# Patient Record
Sex: Female | Born: 1950
Health system: Southern US, Community
[De-identification: ages and names within clinical notes are randomized; demographics above are authoritative.]

## PROBLEM LIST (undated history)

## (undated) DIAGNOSIS — R011 Cardiac murmur, unspecified: Secondary | ICD-10-CM

## (undated) DIAGNOSIS — B019 Varicella without complication: Secondary | ICD-10-CM

## (undated) DIAGNOSIS — K5790 Diverticulosis of intestine, part unspecified, without perforation or abscess without bleeding: Secondary | ICD-10-CM

## (undated) DIAGNOSIS — C50919 Malignant neoplasm of unspecified site of unspecified female breast: Secondary | ICD-10-CM

## (undated) DIAGNOSIS — M1990 Unspecified osteoarthritis, unspecified site: Secondary | ICD-10-CM

## (undated) HISTORY — DX: Varicella without complication: B01.9

## (undated) HISTORY — DX: Cardiac murmur, unspecified: R01.1

## (undated) HISTORY — PX: ABDOMINAL HYSTERECTOMY: SHX81

## (undated) HISTORY — PX: FRACTURE SURGERY: SHX138

## (undated) HISTORY — DX: Diverticulosis of intestine, part unspecified, without perforation or abscess without bleeding: K57.90

## (undated) HISTORY — DX: Malignant neoplasm of unspecified site of unspecified female breast: C50.919

## (undated) HISTORY — DX: Unspecified osteoarthritis, unspecified site: M19.90

---

## 1971-07-08 HISTORY — PX: BREAST BIOPSY: SHX20

## 1992-07-07 HISTORY — PX: OTHER SURGICAL HISTORY: SHX169

## 2012-10-12 ENCOUNTER — Ambulatory Visit (INDEPENDENT_AMBULATORY_CARE_PROVIDER_SITE_OTHER): Admitting: Family Medicine

## 2012-10-12 ENCOUNTER — Encounter: Payer: Self-pay | Admitting: Family Medicine

## 2012-10-12 VITALS — BP 112/80 | Temp 98.5°F | Ht 62.5 in | Wt 133.0 lb

## 2012-10-12 DIAGNOSIS — M25869 Other specified joint disorders, unspecified knee: Secondary | ICD-10-CM

## 2012-10-12 DIAGNOSIS — Z7189 Other specified counseling: Secondary | ICD-10-CM

## 2012-10-12 DIAGNOSIS — M1710 Unilateral primary osteoarthritis, unspecified knee: Secondary | ICD-10-CM

## 2012-10-12 DIAGNOSIS — J309 Allergic rhinitis, unspecified: Secondary | ICD-10-CM

## 2012-10-12 DIAGNOSIS — M1711 Unilateral primary osteoarthritis, right knee: Secondary | ICD-10-CM

## 2012-10-12 DIAGNOSIS — Z7689 Persons encountering health services in other specified circumstances: Secondary | ICD-10-CM

## 2012-10-12 DIAGNOSIS — IMO0002 Reserved for concepts with insufficient information to code with codable children: Secondary | ICD-10-CM

## 2012-10-12 LAB — LIPID PANEL
HDL: 40.5 mg/dL (ref >39.00)
Triglycerides: 135 mg/dL (ref 0.0–149.0)

## 2012-10-12 LAB — HEMOGLOBIN A1C: Hgb A1c MFr Bld: 5.6 % (ref 4.6–6.5)

## 2012-10-12 MED ORDER — FLUTICASONE PROPIONATE 50 MCG/ACT NA SUSP: 2.0000 | Freq: Every day | NASAL | Status: DC

## 2012-10-12 NOTE — Progress Notes (Signed)
Chief Complaint  Patient presents with  . Establish Care    HPI:  Joanne Vaughn is here to establish care. Moved here from [place] recently. Last PCP and physical: hx of complete hysterectomy for fibroids so doctor in [place] did not do paps, has been a few years since had a mammogram  Has the following chronic problems and concerns today:  Patient Active Problem List  Diagnosis  . Patellofemoral dysfunction  . Allergic rhinitis  . Osteoarthritis of knees and back   OA: -in knees and neck and back -sees a chiropractor and this is helping some -does not take calcium or vitamin D  AR: -eye issues for a year - redness, itchy -has sneezing, a nasal symtoms  Health Maintenance: -s/p hysterectomy -had colonoscopy in 2009 - no polyps mammo a few years ago  ROS: See pertinent positives and negatives per HPI.  Past Medical History  Diagnosis Date  . Arthritis   . Chicken pox   . Heart murmur     since childhood, benign  . Diverticulosis     Family History  Problem Relation Age of Onset  . Arthritis      parent/grandparent  . Heart disease      parent/grandparent  . Diabetes Other   . Heart disease Mother 17    MI  . COPD Father   . Osteoarthritis Father   . Diabetes Sister   . Asthma Sister     History   Social History  . Marital Status: Married    Spouse Name: N/A    Number of Children: N/A  . Years of Education: N/A   Social History Main Topics  . Smoking status: Never Smoker   . Smokeless tobacco: None  . Alcohol Use: No  . Drug Use: None  . Sexually Active: None   Other Topics Concern  . 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  GENERAL: vitals reviewed and listed above, alert, oriented, appears well hydrated and in no acute distress  HEENT: atraumatic, conjunttiva clear, no obvious abnormalities on inspection of external nose and ears, normal appearance of ear canals and TMs, clear nasal congestion, pale boggy tubinates, mild post oropharyngeal erythema with PND and cobblestoning, no tonsillar edema or exudate, no sinus TTP   NECK: no obvious masses on inspection  LUNGS: clear to auscultation bilaterally, no wheezes, rales or rhonchi, good air movement  CV: HRRR, no peripheral edema  MS: moves all extremities without noticeable abnormality -gait is normal, normal inspection of static knees, J sign bilat, VM atrophy, +petellar crepitus, + single leg squat, no jt line TTP, neg ant/post drawer, neg lachman, neg mcmurry  PSYCH: pleasant and cooperative, no obvious depression or anxiety  ASSESSMENT AND PLAN:  Discussed the following assessment and plan:  Encounter to establish care - Plan: Hemoglobin A1c, Lipid Panel  Osteoarthritis  of knees and back  Allergic rhinitis - Plan: fluticasone (FLONASE) 50 MCG/ACT nasal spray  Patellofemoral dysfunction, unspecified laterality -We reviewed the PMH, PSH, FH, SH, Meds and Allergies. -We provided refills for any medications we will prescribe as needed. -We addressed current concerns per orders and patient instructions. -We have advised patient to follow up per instructions below. -NON-FASTING LABS TODAY  -Patient advised to return or notify a doctor immediately if symptoms worsen or persist or new concerns arise.  Patient Instructions  -We have ordered labs or studies at this visit. It can take up to 1-2 weeks for results and processing. We will contact  you with instructions IF your results are abnormal. Normal results will be released to your [facility]. If you have not heard from [place] or can not find your results in [facility] in 2 weeks please contact our office.  -PLEASE SIGN UP FOR MYCHART TODAY   We recommend the following healthy lifestyle measures: - eat a healthy diet consisting of lots of vegetables, fruits, beans, nuts, seeds, healthy meats such as white chicken and fish and whole grains.  - avoid fried foods, fast food, processed foods, sodas, red meet and other fattening foods.  - get a least 150 minutes of aerobic exercise per week.   Take 1000IU of vitamin D3 daily and Calcium 1200mg  daily  For the allergies try zyrtec nightly and the flonase nasal spray - start with 2 sprays each nostril daily for 1 month, then can decrease to 1 spray each nostril daily  For the arthritis pain can use capsacin and menthol products and tylenol up to 500-1000mg  up to three times daily, regular exercise. Do the knee exercises provided.  Schedule a mammogram  See the opthomologist  Follow up in: 3-4 months for physical      Joanne Vaughn, Dahlia Client R.

## 2012-10-12 NOTE — Patient Instructions (Addendum)
-  We have ordered labs or studies at this visit. It can take up to 1-2 weeks for results and processing. We will contact you with instructions IF your results are abnormal. Normal results will be released to your [facility]. If you have not heard from [place] or can not find your results in [facility] in 2 weeks please contact our office.  -PLEASE SIGN UP FOR MYCHART TODAY   We recommend the following healthy lifestyle measures: - eat a healthy diet consisting of lots of vegetables, fruits, beans, nuts, seeds, healthy meats such as white chicken and fish and whole grains.  - avoid fried foods, fast food, processed foods, sodas, red meet and other fattening foods.  - get a least 150 minutes of aerobic exercise per week.   Take 1000IU of vitamin D3 daily and Calcium 1200mg  daily  For the allergies try zyrtec nightly and the flonase nasal spray - start with 2 sprays each nostril daily for 1 month, then can decrease to 1 spray each nostril daily  For the arthritis pain can use capsacin and menthol products and tylenol up to 500-1000mg  up to three times daily, regular exercise. Do the knee exercises provided.  Schedule a mammogram  See the opthomologist  Follow up in: 3-4 months for physical

## 2012-10-13 ENCOUNTER — Telehealth: Payer: Self-pay | Admitting: Family Medicine

## 2012-10-13 NOTE — Telephone Encounter (Signed)
Left a message for pt to return call 

## 2012-10-13 NOTE — Telephone Encounter (Signed)
Called and spoke with pt and pt is aware.  

## 2012-10-13 NOTE — Telephone Encounter (Signed)
Labs look ok. Cholesterol is a little high, but some of this is due to the fact she was not fasting. No medicine advised. Regular exercise and a healthy diet are best.

## 2012-12-15 ENCOUNTER — Encounter: Payer: Self-pay | Admitting: Family Medicine

## 2012-12-15 ENCOUNTER — Ambulatory Visit (INDEPENDENT_AMBULATORY_CARE_PROVIDER_SITE_OTHER): Admitting: Family Medicine

## 2012-12-15 VITALS — BP 130/60 | Temp 98.9°F | Wt 132.0 lb

## 2012-12-15 DIAGNOSIS — W57XXXA Bitten or stung by nonvenomous insect and other nonvenomous arthropods, initial encounter: Secondary | ICD-10-CM

## 2012-12-15 DIAGNOSIS — T148 Other injury of unspecified body region: Secondary | ICD-10-CM

## 2012-12-15 DIAGNOSIS — M2550 Pain in unspecified joint: Secondary | ICD-10-CM

## 2012-12-15 MED ORDER — DOXYCYCLINE HYCLATE 100 MG PO TABS: 100.0000 mg | ORAL_TABLET | Freq: Two times a day (BID) | ORAL | Status: DC

## 2012-12-15 NOTE — Progress Notes (Signed)
  Subjective:    Patient ID: Joanne Vaughn, female    DOB: [birth year], 62 y.o.   MRN: [id number]  HPI Acute visit Patient had tick bite 3 weeks ago left lower leg. She described this as a deer tick She's had a few days now of severe achiness involving hands back neck knees She's not seen any objective evidence for swelling or warmth or erythema. She does have some surrounding erythema around the site of the bite. Denies fever. No headaches. Just some localized itching around the site of tick bite Denies any known drug allergies.  No recent travels.  Past Medical History  Diagnosis Date  . Arthritis   . Chicken pox   . Heart murmur     since childhood, benign  . Diverticulosis    Past Surgical History  Procedure Laterality Date  . Abdominal hysterectomy    . Ovaries removed  1994  . Breast biopsy  1973    benign    reports that she has never smoked. She does not have any smokeless tobacco history on file. She reports that she does not drink alcohol. Her drug history is not on file. family history includes Arthritis in an unspecified family member; Asthma in her sister; COPD in her father; Diabetes in her other and sister; Heart disease in an unspecified family member; Heart disease (age of onset: 34) in her mother; and Osteoarthritis in her father. No Known Allergies    Review of Systems  Constitutional: Negative for fever and chills.  Respiratory: Negative for shortness of breath.   Gastrointestinal: Negative for nausea and vomiting.  Musculoskeletal: Positive for arthralgias.  Skin: Positive for rash.  Neurological: Negative for headaches.  Hematological: Negative for adenopathy.       Objective:   Physical Exam  Constitutional: She appears well-developed and well-nourished.  Cardiovascular: Normal rate and regular rhythm.   Pulmonary/Chest: Effort normal and breath sounds normal. No respiratory distress. She has no wheezes. She has no rales.  Musculoskeletal:   She does not have any evidence to suggest joint inflammation such as visible edema, warmth, or erythema  Skin:  Patient has some nonspecific mild erythema surrounding site of recent bite left leg. No visible retained tick parts. No pustules. This is not typical of classic erythema chronicum migrans. No other rashes noted          Assessment & Plan:  New onset arthralgias following recent tick bite. We explained incidence of Lyme disease in [place] very low. We discussed other possibilities such as STARI.  Discussed limitations of antibody testing for Lyme disease. We decided to go and cover with doxycycline 100 mg twice a day for 14 days. Touch base in one to 2 weeks if arthralgias not improving and sooner if any fever headache or worsening symptoms

## 2012-12-15 NOTE — Patient Instructions (Addendum)
Follow up for any fever, headache, progressive rash, or if joint pains not improving over next 1-2 weeks.

## 2013-01-18 ENCOUNTER — Other Ambulatory Visit: Payer: Self-pay | Admitting: Family Medicine

## 2013-01-18 ENCOUNTER — Other Ambulatory Visit: Payer: Self-pay

## 2013-01-18 DIAGNOSIS — Z1231 Encounter for screening mammogram for malignant neoplasm of breast: Secondary | ICD-10-CM

## 2013-02-11 ENCOUNTER — Encounter: Admitting: Family Medicine

## 2013-02-23 ENCOUNTER — Ambulatory Visit
Admission: RE | Admit: 2013-02-23 | Discharge: 2013-02-23 | Disposition: A | Discharge status: Home / Self Care | Source: Ambulatory Visit

## 2013-02-23 DIAGNOSIS — Z1231 Encounter for screening mammogram for malignant neoplasm of breast: Secondary | ICD-10-CM

## 2013-02-24 ENCOUNTER — Other Ambulatory Visit: Payer: Self-pay | Admitting: Family Medicine

## 2013-02-24 DIAGNOSIS — R928 Other abnormal and inconclusive findings on diagnostic imaging of breast: Secondary | ICD-10-CM

## 2013-02-28 ENCOUNTER — Ambulatory Visit (INDEPENDENT_AMBULATORY_CARE_PROVIDER_SITE_OTHER): Admitting: Family Medicine

## 2013-02-28 ENCOUNTER — Encounter: Payer: Self-pay | Admitting: Family Medicine

## 2013-02-28 VITALS — BP 110/82 | Temp 98.4°F | Ht 62.5 in | Wt 135.0 lb

## 2013-02-28 DIAGNOSIS — R928 Other abnormal and inconclusive findings on diagnostic imaging of breast: Secondary | ICD-10-CM

## 2013-02-28 DIAGNOSIS — Z0000 Encounter for general adult medical examination without abnormal findings: Secondary | ICD-10-CM

## 2013-02-28 DIAGNOSIS — Z23 Encounter for immunization: Secondary | ICD-10-CM

## 2013-02-28 DIAGNOSIS — E785 Hyperlipidemia, unspecified: Secondary | ICD-10-CM

## 2013-02-28 DIAGNOSIS — Z2911 Encounter for prophylactic immunotherapy for respiratory syncytial virus (RSV): Secondary | ICD-10-CM

## 2013-02-28 LAB — LIPID PANEL
Cholesterol: 271 mg/dL — ABNORMAL HIGH (ref 0–200)
Total CHOL/HDL Ratio: 7

## 2013-02-28 NOTE — Addendum Note (Signed)
Addended by: Azucena Freed on: 02/28/2013 10:08 AM   Modules accepted: Orders

## 2013-02-28 NOTE — Progress Notes (Signed)
Chief Complaint  Patient presents with  . Annual Exam    HPI:  Here for CPE:  -Concerns today: none  1) abnormal mammo: -[place] and mammo ordered  2)HLD: -she wants to recheck labs today  -Diet: variety of foods, balance and well rounded  -Vit D and calcium: takes calcium and vit D  -Exercise: ellipitical a few times per week and walking  -Diabetes and Dyslipidemia Screening: done 10/2012 - cholesterol borderline  -Hx of HTN: no  -Vaccines: needs tdap, zostavax, flu this year  -pap history:  Hx complete hysterectomy for fibroids  -FDLMP: N/A  -sexual activity: yes, female partner, no new partners  -wants STI testing: no  -FH breast, colon or ovarian ca: see FH -had mammogram this year last week and is getting repeat US and mammo -last colonoscopy 2009 per her report - normal  -Alcohol, Tobacco, drug use: see social history  Review of Systems - Review of Systems  Constitutional: Negative for chills and malaise/fatigue.  Eyes: Negative for blurred vision.  Respiratory: Negative for shortness of breath.   Cardiovascular: Negative for chest pain and palpitations.  Gastrointestinal: Negative for abdominal pain and blood in stool.  Genitourinary: Negative for dysuria.  Musculoskeletal: Negative for myalgias.  Neurological: Negative for dizziness.  Endo/Heme/Allergies: Does not bruise/bleed easily.  Psychiatric/Behavioral: Negative for depression.     Past Medical History  Diagnosis Date  . Arthritis   . Chicken pox   . Heart murmur     since childhood, benign  . Diverticulosis     Family History  Problem Relation Age of Onset  . Arthritis      parent/grandparent  . Heart disease      parent/grandparent  . Diabetes Other   . Heart disease Mother 77    MI  . COPD Father   . Osteoarthritis Father   . Diabetes Sister   . Asthma Sister     History   Social History  . Marital Status: Married    Spouse Name: N/A    Number of Children: N/A  . Years of  Education: N/A   Social History Main Topics  . Smoking status: Never Smoker   . Smokeless tobacco: None  . Alcohol Use: No  . Drug Use: None  . Sexual Activity: None   Other Topics Concern  . 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[place]      Home Situation: lives with husband      Spiritual Beliefs: Marilynne Drivers, Ephriam Knuckles      Lifestyle: elliptical 20 minutes a few times per week, very active - gardening, planting, healthy diet                Current outpatient prescriptions:fluticasone (FLONASE) 50 MCG/ACT nasal spray, Place 2 sprays into the nose as needed., Disp: , Rfl: ;  doxycycline (VIBRA-TABS) 100 MG tablet, Take 1 tablet (100 mg total) by mouth 2 (two) times daily., Disp: 28 tablet, Rfl: 0  EXAM:  Filed Vitals:   02/28/13 0930  BP: 110/82  Temp: 98.4 F (36.9 C)    GENERAL: vitals reviewed and listed below, alert, oriented, appears well hydrated and in no acute distress  HEENT: head atraumatic, PERRLA, normal appearance of eyes, ears, nose and mouth. moist mucus membranes.  NECK: supple, no masses or lymphadenopathy  LUNGS: clear to auscultation bilaterally, no rales, rhonchi or wheeze  CV: HRRR, no peripheral edema or cyanosis, normal pedal pulses  BREAST: deferred  ABDOMEN: bowel sounds normal, soft, non tender to palpation, no masses, no rebound or guarding  GU: deferred  RECTAL: refused  SKIN: no rash or abnormal lesions  MS: normal gait, moves all extremities normally  NEURO: CN II-XII grossly intact, normal muscle strength and sensation to light touch on extremities  PSYCH: normal affect, pleasant and cooperative  ASSESSMENT AND PLAN:  Discussed the following assessment and plan:  Hyperlipemia - Plan: Lipid Panel  Visit for preventive health examination  Abnormal mammogram   -Discussed and advised all [place] preventive services health task force level A and B recommendations for  age, sex and risks.  -shingles and tdap today  -she will get repeat mammo and [place], deferred breast exam - discussed today as she asked but advise breast center will contact her and if she has not heard from breast center to contact breast center  -FASTING lipids today  -Advised at least 150 minutes of exercise per week and a healthy diet low in saturated fats and sweets and consisting of fresh fruits and vegetables, lean meats such as fish and white chicken and whole grains.  -labs, studies and vaccines per orders this encounter  Orders Placed This Encounter  Procedures  . Lipid Panel    There are no Patient Instructions on file for this visit.  Patient advised to return to clinic immediately if symptoms worsen or persist or new concerns.    No Follow-up on file.  Kriste Basque R.

## 2013-03-02 ENCOUNTER — Telehealth: Payer: Self-pay

## 2013-03-02 MED ORDER — PRAVASTATIN SODIUM 40 MG PO TABS: 40.0000 mg | ORAL_TABLET | Freq: Every day | ORAL | Status: DC

## 2013-03-02 NOTE — Progress Notes (Signed)
Quick Note:  Called and spoke with pt and pt is aware. ______ 

## 2013-03-02 NOTE — Progress Notes (Signed)
Quick Note:    Results released to mychart.

## 2013-03-02 NOTE — Progress Notes (Signed)
Quick Note:  Left a message for pt to return call. ______ 

## 2013-03-02 NOTE — Telephone Encounter (Signed)
Rx sent to pharmacy   

## 2013-03-02 NOTE — Telephone Encounter (Signed)
Called pt about lab results.  Pt states the arm she received the zostavax injection in has a 3 inch hard place, it is warm to touch, and pt is having arm pain.  Advised pt to take ibuprofen and apply compresses. Advised pt to follow up if symptoms worsen.   Pt would like to know if Dr. Selena Batten has any more recommendations.

## 2013-03-02 NOTE — Addendum Note (Signed)
Addended by: Azucena Freed on: 03/02/2013 01:26 PM   Modules accepted: Orders

## 2013-03-18 ENCOUNTER — Ambulatory Visit
Admission: RE | Admit: 2013-03-18 | Discharge: 2013-03-18 | Disposition: A | Discharge status: Home / Self Care | Source: Ambulatory Visit | Attending: Family Medicine | Admitting: Family Medicine

## 2013-03-18 DIAGNOSIS — R928 Other abnormal and inconclusive findings on diagnostic imaging of breast: Secondary | ICD-10-CM

## 2013-05-12 ENCOUNTER — Other Ambulatory Visit: Payer: Self-pay

## 2013-09-16 ENCOUNTER — Inpatient Hospital Stay (HOSPITAL_COMMUNITY)
Admission: EM | Admit: 2013-09-16 | Discharge: 2013-09-19 | DRG: 481 | Disposition: A | Discharge status: Home Health | Attending: Internal Medicine | Admitting: Internal Medicine

## 2013-09-16 ENCOUNTER — Emergency Department (HOSPITAL_COMMUNITY)

## 2013-09-16 ENCOUNTER — Encounter (HOSPITAL_COMMUNITY): Payer: Self-pay | Admitting: Emergency Medicine

## 2013-09-16 DIAGNOSIS — Z825 Family history of asthma and other chronic lower respiratory diseases: Secondary | ICD-10-CM

## 2013-09-16 DIAGNOSIS — Z833 Family history of diabetes mellitus: Secondary | ICD-10-CM

## 2013-09-16 DIAGNOSIS — J309 Allergic rhinitis, unspecified: Secondary | ICD-10-CM

## 2013-09-16 DIAGNOSIS — R011 Cardiac murmur, unspecified: Secondary | ICD-10-CM | POA: Diagnosis present

## 2013-09-16 DIAGNOSIS — Z8249 Family history of ischemic heart disease and other diseases of the circulatory system: Secondary | ICD-10-CM

## 2013-09-16 DIAGNOSIS — M1710 Unilateral primary osteoarthritis, unspecified knee: Secondary | ICD-10-CM | POA: Diagnosis present

## 2013-09-16 DIAGNOSIS — S52599A Other fractures of lower end of unspecified radius, initial encounter for closed fracture: Secondary | ICD-10-CM | POA: Diagnosis present

## 2013-09-16 DIAGNOSIS — M1711 Unilateral primary osteoarthritis, right knee: Secondary | ICD-10-CM | POA: Diagnosis present

## 2013-09-16 DIAGNOSIS — S72143A Displaced intertrochanteric fracture of unspecified femur, initial encounter for closed fracture: Principal | ICD-10-CM | POA: Diagnosis present

## 2013-09-16 DIAGNOSIS — M25869 Other specified joint disorders, unspecified knee: Secondary | ICD-10-CM

## 2013-09-16 DIAGNOSIS — S5292XA Unspecified fracture of left forearm, initial encounter for closed fracture: Secondary | ICD-10-CM

## 2013-09-16 DIAGNOSIS — W010XXA Fall on same level from slipping, tripping and stumbling without subsequent striking against object, initial encounter: Secondary | ICD-10-CM | POA: Diagnosis present

## 2013-09-16 DIAGNOSIS — S72142A Displaced intertrochanteric fracture of left femur, initial encounter for closed fracture: Secondary | ICD-10-CM

## 2013-09-16 DIAGNOSIS — Z79899 Other long term (current) drug therapy: Secondary | ICD-10-CM

## 2013-09-16 DIAGNOSIS — S72009A Fracture of unspecified part of neck of unspecified femur, initial encounter for closed fracture: Secondary | ICD-10-CM | POA: Diagnosis present

## 2013-09-16 MED ORDER — FENTANYL CITRATE 0.05 MG/ML IJ SOLN
50.0000 ug | Freq: Once | INTRAMUSCULAR | Status: AC
  Administered 2013-09-16: 50 ug via INTRAMUSCULAR

## 2013-09-16 MED ORDER — FENTANYL CITRATE 0.05 MG/ML IJ SOLN
INTRAMUSCULAR | Status: AC
  Administered 2013-09-16: 50 ug via INTRAMUSCULAR
  Filled 2013-09-16: qty 2

## 2013-09-16 NOTE — ED Notes (Signed)
Per EMS: pt was taking dog outside; fell over dog. C/o swelling to  left wrist; left upper femur tender to palpation.

## 2013-09-16 NOTE — ED Notes (Signed)
Pt fell outside while walking the dog, crawled back to house(raining and chilly), changed clothes when she got inside with her husbands help. Still wearing wet underclothes. Pt undressed carefully, left leg not moved. Wrist then elevated and ice applied.

## 2013-09-16 NOTE — ED Provider Notes (Addendum)
CSN: [id number]     Arrival date & time 09/16/13  2308 History  This chart was scribed for Varney Biles, MD by Elby Beck, ED Scribe. This patient was seen in room APA05/APA05 and the patient's care was started at 11:42 PM.   Chief Complaint  Patient presents with  . Fall  . Hip Pain    The history is provided by the patient. No language interpreter was used.    HPI Comments: Ski Norred is a right-handed 63 y.o. Female with no chronic medical conditions who presents to the Emergency Department complaining of a mechanical fall that occurred earlier tonight when pt reports tripping over her dog in her driveway. She states that she landed on gravel on her left side. She denies head injury or LOC pertaining to the fall. She is complaining of constant, moderate left hip pain and left wrist pain onset after the fall. She reports that she had to crawl after falling and she has not been able to walk without the assistance of her husband. She states that she is unable to bear any weight on her left leg. She states that she does not take any blood thinning medications. She denies any prior injuries or of surgical history to her hip or wrist (other than a cyst removal from the wrist). She denies headache, neck pain or any other pain or symptoms.   Past Medical History  Diagnosis Date  . Arthritis   . Chicken pox   . Heart murmur     since childhood, benign  . Diverticulosis    Past Surgical History  Procedure Laterality Date  . Abdominal hysterectomy    . Ovaries removed  1994  . Breast biopsy  1973    benign   Family History  Problem Relation Age of Onset  . Arthritis      parent/grandparent  . Heart disease      parent/grandparent  . Diabetes Other   . Heart disease Mother 52    MI  . COPD Father   . Osteoarthritis Father   . Diabetes Sister   . Asthma Sister    History  Substance Use Topics  . Smoking status: Never Smoker   . Smokeless tobacco: Not on file  . Alcohol  Use: No   OB History   Grav Para Term Preterm Abortions TAB SAB Ect Mult Living                 Review of Systems  Musculoskeletal: Positive for arthralgias (left wrist, left hip) and gait problem. Negative for neck pain.  Neurological: Negative for syncope and headaches.  All other systems reviewed and are negative.   Allergies  Review of patient's allergies indicates no known allergies.  Home Medications   Current Outpatient Rx  Name  Route  Sig  Dispense  Refill  . doxycycline (VIBRA-TABS) 100 MG tablet   Oral   Take 1 tablet (100 mg total) by mouth 2 (two) times daily.   28 tablet   0   . fluticasone (FLONASE) 50 MCG/ACT nasal spray   Nasal   Place 2 sprays into the nose as needed.         . pravastatin (PRAVACHOL) 40 MG tablet   Oral   Take 1 tablet (40 mg total) by mouth daily.   90 tablet   1   . pravastatin (PRAVACHOL) 40 MG tablet   Oral   Take 1 tablet (40 mg total) by mouth daily.   90 tablet  1    Triage Vitals: BP 141/59  Pulse 69  Temp(Src) 97.8 F (36.6 C) (Oral)  Resp 18  SpO2 100%  Physical Exam  Nursing note and vitals reviewed. Constitutional: She is oriented to person, place, and time. She appears well-developed and well-nourished. No distress.  HENT:  Head: Normocephalic and atraumatic.  No obvious trauma seen. No contusions. No bleeding.   Eyes: EOM are normal.  Neck: Neck supple. No tracheal deviation present.  No midline C-spine tenderness  Cardiovascular: Normal rate, regular rhythm and intact distal pulses.   2+ ulnar and radial pulses.  Pulmonary/Chest: Effort normal and breath sounds normal. No respiratory distress.  Musculoskeletal: She exhibits tenderness.  Right upper extremity exam normal Left upper extremity: There is mild swelling at the left wrist with tenderness on the radial aspect of the wrist. Hand exam shows no tenderness  Left lower extremity: No gross deformity. No contusions. No hematoma. Left hip  tenderness. Right lower extremity: No gross deformity.   Neurological: She is alert and oriented to person, place, and time.  GCS is 15.  Skin: Skin is warm and dry.  Psychiatric: She has a normal mood and affect. Her behavior is normal.    ED Course  Procedures (including critical care time)  DIAGNOSTIC STUDIES: Oxygen Saturation is 100% on RA, normal by my interpretation.    COORDINATION OF CARE: 11:48 PM- Discussed plan to obtain an X-ray of left wrist and X-ray of left hip/pelvis. Will also order Fentanyl. Pt advised of plan for treatment and pt agrees.  Medications  fentaNYL (SUBLIMAZE) injection 50 mcg (50 mcg Intramuscular Given 09/16/13 2345)   Labs Review Labs Reviewed  CBC WITH DIFFERENTIAL - Abnormal; Notable for the following:    WBC 13.8 (*)    Neutrophils Relative % 83 (*)    Neutro Abs 11.4 (*)    Lymphocytes Relative 10 (*)    All other components within normal limits  PROTIME-INR  BASIC METABOLIC PANEL   Imaging Review Dg Chest 1 View  09/17/2013   CLINICAL DATA:  Fall  EXAM: CHEST - 1 VIEW  COMPARISON:  None.  FINDINGS: The heart size and mediastinal contours are within normal limits. Both lungs are clear. The visualized skeletal structures are unremarkable.  IMPRESSION: No active disease.   Electronically Signed   By: Franchot Gallo M.D.   On: 09/17/2013 00:38   Dg Wrist Complete Left  09/17/2013   CLINICAL DATA:  Left wrist pain following a fall tonight.  EXAM: LEFT WRIST - COMPLETE 3+ VIEW  COMPARISON:  None.  FINDINGS: Comminuted and mildly impacted transverse fracture of the distal radial metaphysis with probable extension into the radiocarpal joint. Mild dorsal angulation of the distal fragment without significant displacement.  IMPRESSION: Distal radius fracture, as described above.   Electronically Signed   By: Enrique Sack M.D.   On: 09/17/2013 00:31   Dg Hip Complete Left  09/17/2013   CLINICAL DATA:  Left hip pain following a fall tonight.  EXAM: LEFT  HIP - COMPLETE 2+ VIEW  COMPARISON:  None.  FINDINGS: Left intertrochanteric fracture without significant displacement or angulation.  IMPRESSION: Essentially nondisplaced left intertrochanteric fracture.   Electronically Signed   By: Enrique Sack M.D.   On: 09/17/2013 00:29     EKG Interpretation None      MDM   Final diagnoses:  None   I personally performed the services described in this documentation, which was scribed in my presence. The recorded information has been  reviewed and is accurate.  Pt comes in with cc of fall. Has sustained a distal radial fracture and left femoral intertrochanteric fx. Will need to be admitted.  Varney Biles, MD 09/17/13 0116  1:28 AM Dr. Gladstone Lighter, [place] [facility] will communicate aboutthe patient to one of his partners. Patient will be admitted to [facility]. NPO after MN started, all other decisions, including surgery, will be determined tomorrow.  Pt made aware of this.  Varney Biles, MD 09/17/13 [phone]

## 2013-09-16 NOTE — ED Notes (Signed)
Pt reports she was unable to stand after falling, reports crawling back into the house to wake her husband to call EMS.

## 2013-09-17 ENCOUNTER — Encounter (HOSPITAL_COMMUNITY): Payer: Self-pay | Admitting: Anesthesiology

## 2013-09-17 ENCOUNTER — Encounter (HOSPITAL_COMMUNITY): Admitting: Anesthesiology

## 2013-09-17 ENCOUNTER — Encounter (HOSPITAL_COMMUNITY)
Admission: EM | Disposition: A | Discharge status: Home Health | Payer: Self-pay | Source: Home / Self Care | Attending: Internal Medicine

## 2013-09-17 ENCOUNTER — Inpatient Hospital Stay (HOSPITAL_COMMUNITY)

## 2013-09-17 ENCOUNTER — Inpatient Hospital Stay (HOSPITAL_COMMUNITY): Admitting: Anesthesiology

## 2013-09-17 ENCOUNTER — Emergency Department (HOSPITAL_COMMUNITY)

## 2013-09-17 DIAGNOSIS — S5290XA Unspecified fracture of unspecified forearm, initial encounter for closed fracture: Secondary | ICD-10-CM

## 2013-09-17 DIAGNOSIS — S72009A Fracture of unspecified part of neck of unspecified femur, initial encounter for closed fracture: Secondary | ICD-10-CM | POA: Diagnosis present

## 2013-09-17 DIAGNOSIS — S72143A Displaced intertrochanteric fracture of unspecified femur, initial encounter for closed fracture: Principal | ICD-10-CM

## 2013-09-17 HISTORY — PX: INTRAMEDULLARY (IM) NAIL INTERTROCHANTERIC: SHX5875

## 2013-09-17 LAB — CBC WITH DIFFERENTIAL/PLATELET
BASOS PCT: 0 % (ref 0–1)
Basophils Absolute: 0.1 10*3/uL (ref 0.0–0.1)
EOS PCT: 1 % (ref 0–5)
Eosinophils Absolute: 0.1 10*3/uL (ref 0.0–0.7)
HEMATOCRIT: 37 % (ref 36.0–46.0)
HEMOGLOBIN: 12.7 g/dL (ref 12.0–15.0)
Lymphocytes Relative: 10 % — ABNORMAL LOW (ref 12–46)
Lymphs Abs: 1.4 10*3/uL (ref 0.7–4.0)
MCH: 31.2 pg (ref 26.0–34.0)
MCHC: 34.3 g/dL (ref 30.0–36.0)
MCV: 90.9 fL (ref 78.0–100.0)
MONO ABS: 0.8 10*3/uL (ref 0.1–1.0)
MONOS PCT: 6 % (ref 3–12)
NEUTROS ABS: 11.4 10*3/uL — AB (ref 1.7–7.7)
Neutrophils Relative %: 83 % — ABNORMAL HIGH (ref 43–77)
Platelets: 214 10*3/uL (ref 150–400)
RBC: 4.07 MIL/uL (ref 3.87–5.11)
RDW: 12.7 % (ref 11.5–15.5)
WBC: 13.8 10*3/uL — ABNORMAL HIGH (ref 4.0–10.5)

## 2013-09-17 LAB — URINE MICROSCOPIC-ADD ON

## 2013-09-17 LAB — BASIC METABOLIC PANEL
BUN: 17 mg/dL (ref 6–23)
CALCIUM: 8.9 mg/dL (ref 8.4–10.5)
CHLORIDE: 98 meq/L (ref 96–112)
CO2: 27 mEq/L (ref 19–32)
CREATININE: 0.69 mg/dL (ref 0.50–1.10)
GFR calc non Af Amer: >90 mL/min (ref >90)
Glucose, Bld: 139 mg/dL — ABNORMAL HIGH (ref 70–99)
Potassium: 3.6 mEq/L — ABNORMAL LOW (ref 3.7–5.3)
Sodium: 139 mEq/L (ref 137–147)

## 2013-09-17 LAB — URINALYSIS, ROUTINE W REFLEX MICROSCOPIC
Bilirubin Urine: NEGATIVE
GLUCOSE, UA: NEGATIVE mg/dL
Ketones, ur: 15 mg/dL — AB
Leukocytes, UA: NEGATIVE
Nitrite: NEGATIVE
Protein, ur: NEGATIVE mg/dL
Specific Gravity, Urine: 1.02 (ref 1.005–1.030)
Urobilinogen, UA: 0.2 mg/dL (ref 0.0–1.0)
pH: 6 (ref 5.0–8.0)

## 2013-09-17 LAB — PROTIME-INR
INR: 0.97 (ref 0.00–1.49)
Prothrombin Time: 12.7 seconds (ref 11.6–15.2)

## 2013-09-17 LAB — MRSA PCR SCREENING: MRSA by PCR: NEGATIVE

## 2013-09-17 SURGERY — FIXATION, FRACTURE, INTERTROCHANTERIC, WITH INTRAMEDULLARY ROD
Anesthesia: General | Site: Hip | Laterality: Left

## 2013-09-17 MED ORDER — FLEET ENEMA 7-19 GM/118ML RE ENEM
1.0000 | ENEMA | Freq: Once | RECTAL | Status: AC | PRN
  Filled 2013-09-17: qty 1

## 2013-09-17 MED ORDER — METOCLOPRAMIDE HCL 5 MG/ML IJ SOLN: 5.0000 mg | Freq: Three times a day (TID) | INTRAMUSCULAR | Status: DC | PRN

## 2013-09-17 MED ORDER — ONDANSETRON HCL 4 MG/2ML IJ SOLN: 4.0000 mg | Freq: Four times a day (QID) | INTRAMUSCULAR | Status: DC | PRN

## 2013-09-17 MED ORDER — HYDROMORPHONE HCL PF 1 MG/ML IJ SOLN
1.0000 mg | INTRAMUSCULAR | Status: DC | PRN
  Administered 2013-09-17 (×2): 1 mg via INTRAVENOUS
  Filled 2013-09-17 (×2): qty 1

## 2013-09-17 MED ORDER — EPHEDRINE SULFATE 50 MG/ML IJ SOLN
INTRAMUSCULAR | Status: AC
  Filled 2013-09-17: qty 1

## 2013-09-17 MED ORDER — METHOCARBAMOL 500 MG PO TABS
500.0000 mg | ORAL_TABLET | Freq: Four times a day (QID) | ORAL | Status: DC | PRN
  Administered 2013-09-18 (×2): 500 mg via ORAL
  Filled 2013-09-17 (×3): qty 1

## 2013-09-17 MED ORDER — 0.9 % SODIUM CHLORIDE (POUR BTL) OPTIME
TOPICAL | Status: DC | PRN
  Administered 2013-09-17: 1000 mL

## 2013-09-17 MED ORDER — GLYCOPYRROLATE 0.2 MG/ML IJ SOLN
INTRAMUSCULAR | Status: AC
  Filled 2013-09-17: qty 2

## 2013-09-17 MED ORDER — CISATRACURIUM BESYLATE 20 MG/10ML IV SOLN
INTRAVENOUS | Status: AC
  Filled 2013-09-17: qty 10

## 2013-09-17 MED ORDER — TRAMADOL HCL 50 MG PO TABS
50.0000 mg | ORAL_TABLET | Freq: Four times a day (QID) | ORAL | Status: DC | PRN
  Administered 2013-09-19: 100 mg via ORAL
  Filled 2013-09-17: qty 1
  Filled 2013-09-17: qty 2

## 2013-09-17 MED ORDER — DOCUSATE SODIUM 100 MG PO CAPS
100.0000 mg | ORAL_CAPSULE | Freq: Two times a day (BID) | ORAL | Status: DC
  Administered 2013-09-17 – 2013-09-18 (×4): 100 mg via ORAL

## 2013-09-17 MED ORDER — SODIUM CHLORIDE 0.9 % IJ SOLN
INTRAMUSCULAR | Status: AC
  Filled 2013-09-17: qty 10

## 2013-09-17 MED ORDER — ACETAMINOPHEN 650 MG RE SUPP
650.0000 mg | Freq: Four times a day (QID) | RECTAL | Status: DC | PRN
  Filled 2013-09-17: qty 1

## 2013-09-17 MED ORDER — OXYCODONE HCL 5 MG PO TABS
5.0000 mg | ORAL_TABLET | ORAL | Status: DC | PRN
  Administered 2013-09-17 – 2013-09-19 (×5): 10 mg via ORAL
  Filled 2013-09-17 (×6): qty 2

## 2013-09-17 MED ORDER — PHENYLEPHRINE 40 MCG/ML (10ML) SYRINGE FOR IV PUSH (FOR BLOOD PRESSURE SUPPORT)
PREFILLED_SYRINGE | INTRAVENOUS | Status: AC
  Filled 2013-09-17: qty 10

## 2013-09-17 MED ORDER — LIDOCAINE HCL (CARDIAC) 20 MG/ML IV SOLN
INTRAVENOUS | Status: AC
  Filled 2013-09-17: qty 10

## 2013-09-17 MED ORDER — FENTANYL CITRATE 0.05 MG/ML IJ SOLN
INTRAMUSCULAR | Status: DC | PRN
Start: 2013-09-17 — End: 2013-09-17
  Administered 2013-09-17: 50 ug via INTRAVENOUS
  Administered 2013-09-17: 25 ug via INTRAVENOUS
  Administered 2013-09-17: 75 ug via INTRAVENOUS
  Administered 2013-09-17: 25 ug via INTRAVENOUS

## 2013-09-17 MED ORDER — METHOCARBAMOL 100 MG/ML IJ SOLN
500.0000 mg | Freq: Four times a day (QID) | INTRAVENOUS | Status: DC | PRN
  Administered 2013-09-17: 500 mg via INTRAVENOUS
  Filled 2013-09-17: qty 5

## 2013-09-17 MED ORDER — BISACODYL 10 MG RE SUPP
10.0000 mg | Freq: Every day | RECTAL | Status: DC | PRN
  Filled 2013-09-17: qty 1

## 2013-09-17 MED ORDER — KETAMINE HCL 10 MG/ML IJ SOLN
INTRAMUSCULAR | Status: DC | PRN
  Administered 2013-09-17: 15 mg via INTRAVENOUS
  Administered 2013-09-17: 10 mg via INTRAVENOUS

## 2013-09-17 MED ORDER — MEPERIDINE HCL 50 MG/ML IJ SOLN: 6.2500 mg | INTRAMUSCULAR | Status: DC | PRN

## 2013-09-17 MED ORDER — MIDAZOLAM HCL 2 MG/2ML IJ SOLN
INTRAMUSCULAR | Status: AC
  Filled 2013-09-17: qty 2

## 2013-09-17 MED ORDER — KETOROLAC TROMETHAMINE 30 MG/ML IJ SOLN
15.0000 mg | Freq: Once | INTRAMUSCULAR | Status: AC | PRN
  Administered 2013-09-17: 30 mg via INTRAVENOUS

## 2013-09-17 MED ORDER — ONDANSETRON HCL 4 MG/2ML IJ SOLN
INTRAMUSCULAR | Status: AC
  Filled 2013-09-17: qty 2

## 2013-09-17 MED ORDER — CISATRACURIUM BESYLATE (PF) 10 MG/5ML IV SOLN
INTRAVENOUS | Status: DC | PRN
  Administered 2013-09-17: 6 mg via INTRAVENOUS

## 2013-09-17 MED ORDER — MENTHOL 3 MG MT LOZG
1.0000 | LOZENGE | OROMUCOSAL | Status: DC | PRN
  Administered 2013-09-18: 3 mg via ORAL
  Filled 2013-09-17 (×2): qty 9

## 2013-09-17 MED ORDER — LIDOCAINE HCL (CARDIAC) 20 MG/ML IV SOLN
INTRAVENOUS | Status: DC | PRN
  Administered 2013-09-17: 30 mg via INTRAVENOUS

## 2013-09-17 MED ORDER — ACETAMINOPHEN 325 MG PO TABS: 650.0000 mg | ORAL_TABLET | Freq: Four times a day (QID) | ORAL | Status: DC | PRN

## 2013-09-17 MED ORDER — HYDROMORPHONE HCL PF 1 MG/ML IJ SOLN
INTRAMUSCULAR | Status: AC
  Filled 2013-09-17: qty 1

## 2013-09-17 MED ORDER — POLYETHYLENE GLYCOL 3350 17 G PO PACK
17.0000 g | PACK | Freq: Every day | ORAL | Status: DC | PRN
  Filled 2013-09-17: qty 1

## 2013-09-17 MED ORDER — ENOXAPARIN SODIUM 40 MG/0.4ML ~~LOC~~ SOLN
40.0000 mg | SUBCUTANEOUS | Status: DC
  Filled 2013-09-17: qty 0.4

## 2013-09-17 MED ORDER — GLYCOPYRROLATE 0.2 MG/ML IJ SOLN
INTRAMUSCULAR | Status: DC | PRN
  Administered 2013-09-17: 0.4 mg via INTRAVENOUS
  Administered 2013-09-17: 0.2 mg via INTRAVENOUS

## 2013-09-17 MED ORDER — CEFAZOLIN SODIUM-DEXTROSE 2-3 GM-% IV SOLR
2.0000 g | Freq: Four times a day (QID) | INTRAVENOUS | Status: AC
  Administered 2013-09-17 (×2): 2 g via INTRAVENOUS
  Filled 2013-09-17 (×2): qty 50

## 2013-09-17 MED ORDER — ZOLPIDEM TARTRATE 5 MG PO TABS
5.0000 mg | ORAL_TABLET | Freq: Every evening | ORAL | Status: DC | PRN
  Administered 2013-09-17: 5 mg via ORAL
  Filled 2013-09-17: qty 1

## 2013-09-17 MED ORDER — ONDANSETRON HCL 4 MG/2ML IJ SOLN
INTRAMUSCULAR | Status: AC
  Administered 2013-09-17: 4 mg
  Filled 2013-09-17: qty 2

## 2013-09-17 MED ORDER — PROPOFOL 10 MG/ML IV BOLUS
INTRAVENOUS | Status: AC
  Filled 2013-09-17: qty 20

## 2013-09-17 MED ORDER — PROPOFOL 10 MG/ML IV BOLUS
INTRAVENOUS | Status: DC | PRN
  Administered 2013-09-17: 130 mg via INTRAVENOUS

## 2013-09-17 MED ORDER — HYDROMORPHONE HCL PF 1 MG/ML IJ SOLN
1.0000 mg | Freq: Once | INTRAMUSCULAR | Status: AC
Start: 2013-09-17 — End: 2013-09-17
  Administered 2013-09-17: 1 mg via INTRAVENOUS
  Filled 2013-09-17: qty 1

## 2013-09-17 MED ORDER — SUCCINYLCHOLINE CHLORIDE 20 MG/ML IJ SOLN
INTRAMUSCULAR | Status: DC | PRN
  Administered 2013-09-17: 100 mg via INTRAVENOUS

## 2013-09-17 MED ORDER — NEOSTIGMINE METHYLSULFATE 1 MG/ML IJ SOLN
INTRAMUSCULAR | Status: DC | PRN
  Administered 2013-09-17: 3 mg via INTRAVENOUS

## 2013-09-17 MED ORDER — CEFAZOLIN SODIUM-DEXTROSE 2-3 GM-% IV SOLR
2.0000 g | Freq: Once | INTRAVENOUS | Status: AC
  Administered 2013-09-17: 2 g via INTRAVENOUS
  Filled 2013-09-17: qty 50

## 2013-09-17 MED ORDER — LACTATED RINGERS IV SOLN
INTRAVENOUS | Status: DC | PRN
  Administered 2013-09-17: 09:00:00 via INTRAVENOUS

## 2013-09-17 MED ORDER — DEXAMETHASONE SODIUM PHOSPHATE 4 MG/ML IJ SOLN
INTRAMUSCULAR | Status: DC | PRN
  Administered 2013-09-17: 10 mg via INTRAVENOUS

## 2013-09-17 MED ORDER — POTASSIUM CHLORIDE IN NACL 20-0.9 MEQ/L-% IV SOLN
INTRAVENOUS | Status: AC
  Administered 2013-09-17: 12:00:00 via INTRAVENOUS
  Filled 2013-09-17: qty 1000

## 2013-09-17 MED ORDER — MORPHINE SULFATE 2 MG/ML IJ SOLN
1.0000 mg | INTRAMUSCULAR | Status: DC | PRN
  Administered 2013-09-17 – 2013-09-19 (×4): 2 mg via INTRAVENOUS
  Filled 2013-09-17 (×4): qty 1

## 2013-09-17 MED ORDER — POTASSIUM CHLORIDE IN NACL 20-0.9 MEQ/L-% IV SOLN
INTRAVENOUS | Status: AC
  Filled 2013-09-17: qty 1000

## 2013-09-17 MED ORDER — KETAMINE HCL 10 MG/ML IJ SOLN
INTRAMUSCULAR | Status: AC
  Filled 2013-09-17: qty 1

## 2013-09-17 MED ORDER — SIMVASTATIN 20 MG PO TABS
20.0000 mg | ORAL_TABLET | Freq: Every day | ORAL | Status: DC
  Filled 2013-09-17 (×3): qty 1

## 2013-09-17 MED ORDER — PHENOL 1.4 % MT LIQD
1.0000 | OROMUCOSAL | Status: DC | PRN
  Filled 2013-09-17: qty 177

## 2013-09-17 MED ORDER — MIDAZOLAM HCL 5 MG/5ML IJ SOLN
INTRAMUSCULAR | Status: DC | PRN
  Administered 2013-09-17: 1 mg via INTRAVENOUS

## 2013-09-17 MED ORDER — POTASSIUM CHLORIDE CRYS ER 20 MEQ PO TBCR
40.0000 meq | EXTENDED_RELEASE_TABLET | Freq: Once | ORAL | Status: DC
  Filled 2013-09-17: qty 2

## 2013-09-17 MED ORDER — PROMETHAZINE HCL 25 MG/ML IJ SOLN: 6.2500 mg | INTRAMUSCULAR | Status: DC | PRN

## 2013-09-17 MED ORDER — HYDROMORPHONE HCL PF 1 MG/ML IJ SOLN
0.2500 mg | INTRAMUSCULAR | Status: DC | PRN
  Administered 2013-09-17 (×2): 0.5 mg via INTRAVENOUS

## 2013-09-17 MED ORDER — MORPHINE SULFATE 10 MG/ML IJ SOLN: 1.0000 mg | INTRAMUSCULAR | Status: DC | PRN

## 2013-09-17 MED ORDER — ONDANSETRON HCL 4 MG PO TABS
4.0000 mg | ORAL_TABLET | Freq: Four times a day (QID) | ORAL | Status: DC | PRN
  Filled 2013-09-17: qty 1

## 2013-09-17 MED ORDER — METOCLOPRAMIDE HCL 5 MG PO TABS
5.0000 mg | ORAL_TABLET | Freq: Three times a day (TID) | ORAL | Status: DC | PRN
  Filled 2013-09-17: qty 2

## 2013-09-17 MED ORDER — ENOXAPARIN SODIUM 40 MG/0.4ML ~~LOC~~ SOLN
40.0000 mg | SUBCUTANEOUS | Status: DC
  Administered 2013-09-18 – 2013-09-19 (×2): 40 mg via SUBCUTANEOUS
  Filled 2013-09-17 (×3): qty 0.4

## 2013-09-17 MED ORDER — FENTANYL CITRATE 0.05 MG/ML IJ SOLN
INTRAMUSCULAR | Status: AC
  Filled 2013-09-17: qty 5

## 2013-09-17 MED ORDER — KETOROLAC TROMETHAMINE 30 MG/ML IJ SOLN
INTRAMUSCULAR | Status: AC
  Filled 2013-09-17: qty 1

## 2013-09-17 MED ORDER — ONDANSETRON HCL 4 MG/2ML IJ SOLN
INTRAMUSCULAR | Status: DC | PRN
  Administered 2013-09-17 (×2): 2 mg via INTRAVENOUS

## 2013-09-17 SURGICAL SUPPLY — 33 items
BANDAGE GAUZE ELAST BULKY 4 IN (GAUZE/BANDAGES/DRESSINGS) ×2 IMPLANT
BIT DRILL CANN LG 4.3MM (BIT) ×1 IMPLANT
BNDG COHESIVE 6X5 TAN STRL LF (GAUZE/BANDAGES/DRESSINGS) ×2 IMPLANT
CLOSURE STERI-STRIP 1/4X4 (GAUZE/BANDAGES/DRESSINGS) ×2 IMPLANT
DRAPE STERI IOBAN 125X83 (DRAPES) ×2 IMPLANT
DRILL BIT CANN LG 4.3MM (BIT) ×2
DRSG ADAPTIC 3X8 NADH LF (GAUZE/BANDAGES/DRESSINGS) ×2 IMPLANT
DRSG MEPILEX BORDER 4X8 (GAUZE/BANDAGES/DRESSINGS) ×4 IMPLANT
DURAPREP 26ML APPLICATOR (WOUND CARE) ×2 IMPLANT
GLOVE BIO SURGEON STRL SZ7.5 (GLOVE) ×2 IMPLANT
GLOVE BIO SURGEON STRL SZ8 (GLOVE) ×2 IMPLANT
GLOVE BIOGEL PI IND STRL 8 (GLOVE) ×1 IMPLANT
GLOVE BIOGEL PI INDICATOR 8 (GLOVE) ×1
GOWN STRL REUS W/TWL LRG LVL3 (GOWN DISPOSABLE) ×2 IMPLANT
GOWN STRL REUS W/TWL XL LVL3 (GOWN DISPOSABLE) ×2 IMPLANT
GUIDEPIN 3.2X17.5 THRD DISP (PIN) ×2 IMPLANT
HIP FRA NAIL LAG SCREW 10.5X90 (Orthopedic Implant) ×2 IMPLANT
KIT BASIN OR (CUSTOM PROCEDURE TRAY) ×2 IMPLANT
MANIFOLD NEPTUNE II (INSTRUMENTS) ×2 IMPLANT
NAIL HIP FRACT 130D 11X180 (Screw) ×2 IMPLANT
NS IRRIG 1000ML POUR BTL (IV SOLUTION) ×2 IMPLANT
PACK GENERAL/GYN (CUSTOM PROCEDURE TRAY) ×2 IMPLANT
PADDING CAST COTTON 6X4 STRL (CAST SUPPLIES) ×2 IMPLANT
POSITIONER SURGICAL ARM (MISCELLANEOUS) ×4 IMPLANT
SCREW BONE CORTICAL 5.0X3 (Screw) ×2 IMPLANT
SCREW LAG HIP FRA NAIL 10.5X90 (Orthopedic Implant) ×1 IMPLANT
SUT MNCRL AB 4-0 PS2 18 (SUTURE) ×2 IMPLANT
SUT VIC AB 0 CT1 27 (SUTURE) ×1
SUT VIC AB 0 CT1 27XBRD ANTBC (SUTURE) ×1 IMPLANT
SUT VIC AB 2-0 CT1 27 (SUTURE) ×1
SUT VIC AB 2-0 CT1 TAPERPNT 27 (SUTURE) ×1 IMPLANT
TOWEL OR 17X26 10 PK STRL BLUE (TOWEL DISPOSABLE) ×2 IMPLANT
TOWEL OR NON WOVEN STRL DISP B (DISPOSABLE) ×2 IMPLANT

## 2013-09-17 NOTE — H&P (Signed)
PCP:   Lucretia Kern., DO   Chief Complaint:  Fall  HPI: 63 year old female who   has a past medical history of Arthritis; Chicken pox; Heart murmur; and Diverticulosis. Today presented to the ED after patient had a mechanical fall that occurred around 10 PM when patient tripped over her dog  in the driveway, patient landed on the table on her left side and had left wrist pain and left hip pain. She denied head injury there was no loss of consciousness. She denies blurred vision. After that patient had to crawl as she was notable to walk. Patient was unable to bear any weight on the left leg. In the ED x-ray of the hip and wrist were done which showed nondisplaced left intertrochanteric fracture and left distal radius fracture. Patient was given Dilaudid for pain at this time pain is controlled.  Allergies:  No Known Allergies    Past Medical History  Diagnosis Date  . Arthritis   . Chicken pox   . Heart murmur     since childhood, benign  . Diverticulosis     Past Surgical History  Procedure Laterality Date  . Abdominal hysterectomy    . Ovaries removed  1994  . Breast biopsy  1973    benign    Prior to Admission medications   Medication Sig Start Date End Date Taking? Authorizing Provider  doxycycline (VIBRA-TABS) 100 MG tablet Take 1 tablet (100 mg total) by mouth 2 (two) times daily. 12/15/12   Eulas Post, MD  fluticasone (FLONASE) 50 MCG/ACT nasal spray Place 2 sprays into the nose as needed. 10/12/12   Lucretia Kern, DO  pravastatin (PRAVACHOL) 40 MG tablet Take 1 tablet (40 mg total) by mouth daily. 03/02/13   Lucretia Kern, DO  pravastatin (PRAVACHOL) 40 MG tablet Take 1 tablet (40 mg total) by mouth daily. 03/02/13   Lucretia Kern, DO    Social History:  reports that she has never smoked. She does not have any smokeless tobacco history on file. She reports that she does not drink alcohol. Her drug history is not on file.  Family History  Problem Relation Age of  Onset  . Arthritis      parent/grandparent  . Heart disease      parent/grandparent  . Diabetes Other   . Heart disease Mother 37    MI  . COPD Father   . Osteoarthritis Father   . Diabetes Sister   . Asthma Sister      All the positives are listed in BOLD  Review of Systems:  HEENT: Headache, blurred vision, runny nose, sore throat Neck: Hypothyroidism, hyperthyroidism,,lymphadenopathy Chest : Shortness of breath, history of COPD, Asthma Heart : Chest pain, history of coronary arterey disease GI:  Nausea, vomiting, diarrhea, constipation, GERD GU: Dysuria, urgency, frequency of urination, hematuria Neuro: Stroke, seizures, syncope Psych: Depression, anxiety, hallucinations   Physical Exam: Blood pressure 141/59, pulse 69, temperature 97.8 F (36.6 C), temperature source Oral, resp. rate 18, SpO2 100.00%. Constitutional:   Patient is a well-developed and well-nourished female in no acute distress and cooperative with exam. Head: Normocephalic and atraumatic Mouth: Mucus membranes moist Eyes: PERRL, EOMI, conjunctivae normal Neck: Supple, No Thyromegaly Cardiovascular: RRR, S1 normal, S2 normal, positive systolic murmur in the mitral area Pulmonary/Chest: CTAB, no wheezes, rales, or rhonchi Abdominal: Soft. Non-tender, non-distended, bowel sounds are normal, no masses, organomegaly, or guarding present.  Neurological: A&O x3, Strenght is normal and symmetric bilaterally, cranial nerve II-XII  are grossly intact, no focal motor deficit, sensory intact to light touch bilaterally.  Extremities : No Cyanosis, Clubbing or Edema   Labs on Admission:  Results for orders placed during the hospital encounter of 09/16/13 (from the past 48 hour(s))  CBC WITH DIFFERENTIAL     Status: Abnormal   Collection Time    09/17/13 12:35 AM      Result Value Ref Range   WBC 13.8 (*) 4.0 - 10.5 K/uL   RBC 4.07  3.87 - 5.11 MIL/uL   Hemoglobin 12.7  12.0 - 15.0 g/dL   HCT 37.0  36.0 - 46.0  %   MCV 90.9  78.0 - 100.0 fL   MCH 31.2  26.0 - 34.0 pg   MCHC 34.3  30.0 - 36.0 g/dL   RDW 12.7  11.5 - 15.5 %   Platelets 214  150 - 400 K/uL   Neutrophils Relative % 83 (*) 43 - 77 %   Neutro Abs 11.4 (*) 1.7 - 7.7 K/uL   Lymphocytes Relative 10 (*) 12 - 46 %   Lymphs Abs 1.4  0.7 - 4.0 K/uL   Monocytes Relative 6  3 - 12 %   Monocytes Absolute 0.8  0.1 - 1.0 K/uL   Eosinophils Relative 1  0 - 5 %   Eosinophils Absolute 0.1  0.0 - 0.7 K/uL   Basophils Relative 0  0 - 1 %   Basophils Absolute 0.1  0.0 - 0.1 K/uL  BASIC METABOLIC PANEL     Status: Abnormal   Collection Time    09/17/13 12:35 AM      Result Value Ref Range   Sodium 139  137 - 147 mEq/L   Potassium 3.6 (*) 3.7 - 5.3 mEq/L   Chloride 98  96 - 112 mEq/L   CO2 27  19 - 32 mEq/L   Glucose, Bld 139 (*) 70 - 99 mg/dL   BUN 17  6 - 23 mg/dL   Creatinine, Ser 0.69  0.50 - 1.10 mg/dL   Calcium 8.9  8.4 - 10.5 mg/dL   GFR calc non Af Amer >90  >90 mL/min   GFR calc Af Amer >90  >90 mL/min   Comment: (NOTE)     The eGFR has been calculated using the CKD EPI equation.     This calculation has not been validated in all clinical situations.     eGFR's persistently <90 mL/min signify possible Chronic Kidney     Disease.  PROTIME-INR     Status: None   Collection Time    09/17/13 12:35 AM      Result Value Ref Range   Prothrombin Time 12.7  11.6 - 15.2 seconds   INR 0.97  0.00 - 1.49    Radiological Exams on Admission: Dg Chest 1 View  09/17/2013   CLINICAL DATA:  Fall  EXAM: CHEST - 1 VIEW  COMPARISON:  None.  FINDINGS: The heart size and mediastinal contours are within normal limits. Both lungs are clear. The visualized skeletal structures are unremarkable.  IMPRESSION: No active disease.   Electronically Signed   By: Franchot Gallo M.D.   On: 09/17/2013 00:38   Dg Wrist Complete Left  09/17/2013   CLINICAL DATA:  Left wrist pain following a fall tonight.  EXAM: LEFT WRIST - COMPLETE 3+ VIEW  COMPARISON:  None.   FINDINGS: Comminuted and mildly impacted transverse fracture of the distal radial metaphysis with probable extension into the radiocarpal joint. Mild dorsal angulation  of the distal fragment without significant displacement.  IMPRESSION: Distal radius fracture, as described above.   Electronically Signed   By: Enrique Sack M.D.   On: 09/17/2013 00:31   Dg Hip Complete Left  09/17/2013   CLINICAL DATA:  Left hip pain following a fall tonight.  EXAM: LEFT HIP - COMPLETE 2+ VIEW  COMPARISON:  None.  FINDINGS: Left intertrochanteric fracture without significant displacement or angulation.  IMPRESSION: Essentially nondisplaced left intertrochanteric fracture.   Electronically Signed   By: Enrique Sack M.D.   On: 09/17/2013 00:29    Assessment/Plan Principal Problem:   Hip fracture Active Problems:   Osteoarthritis of knees and back  Hip fracture Patient has left intertrochanteric hip fracture, Dr. Gladstone Lighter orthopedics at [facility] has been consulted. A shunt will be transferred to [facility] for surgery in a.m. Will keep the patient n.p.o. and initiated hip fracture protocol.  Pain control Patient will be started on Dilaudid when necessary for the pain.  Risk stratification Patient does not have any cardiac history, except history of murmur. Patient is moderate risk for surgery.  DVT prophylaxis Lovenox  Code status: Patient is full code  Family discussion: Discussed with patient's sister at bedside   Time Spent on Admission: 60 minutes  Jeannette Maddy S [facility] Pager: [phone] 09/17/2013, 2:04 AM  If 7PM-7AM, please contact night-coverage  [email or web address]  Password [email or web address]

## 2013-09-17 NOTE — Progress Notes (Signed)
[facility] progress note. Chief complaint. Transfer note. History of present illness. This 63 year old female tripped over her dog in her driveway and sustained a fall injuring her left wrist and left hip. X-rays indicated nondisplaced left intertrochanteric fracture and left distal radius fracture. Patient was consulted with orthopedics and transferred to [facility] for surgery in the a.m. Patient has now arrived am seeing her at bedside to ensure continued stability and that her orders transferred appropriately. Vital signs. Temperature 98.2, pulse 77, respiration 16, blood pressure 121/66. O2 sats 100%. General appearance. Well-developed female who is alert and in no distress. Cardiac. Rate and rhythm regular. Lungs. Breath sounds clear and equal. Abdomen. Soft with positive bowel sounds. Musculoskeletal. As a soft cast on the left arm. Right arm and leg mobility and strength are normal. Did not attempt to move the left leg. Impression/plan. Problem #1. Left intertrochanteric hip fracture. Orthopedics will followup planned surgery in the a.m. 4 ORIF left hip. Patient casting on the left arm appears stable. Problem #2. Pain control. States her pain is adequately controlled on current medications. Patient appears clinically stable post transfer. All orders appear to have transferred appropriately.

## 2013-09-17 NOTE — Anesthesia Preprocedure Evaluation (Signed)
Anesthesia Evaluation  Patient identified by MRN, date of birth, ID band Patient awake    Reviewed: Allergy & Precautions, H&P , NPO status , Patient's Chart, lab work & pertinent test results  Airway Mallampati: I TM Distance: >3 FB Neck ROM: full    Dental no notable dental hx.    Pulmonary neg pulmonary ROS,    Pulmonary exam normal       Cardiovascular Exercise Tolerance: Good negative cardio ROS      Neuro/Psych negative neurological ROS  negative psych ROS   GI/Hepatic negative GI ROS, Neg liver ROS,   Endo/Other  negative endocrine ROS  Renal/GU negative Renal ROS  negative genitourinary   Musculoskeletal   Abdominal Normal abdominal exam  (+)   Peds  Hematology negative hematology ROS (+)   Anesthesia Other Findings   Reproductive/Obstetrics negative OB ROS                           Anesthesia Physical Anesthesia Plan  ASA: II  Anesthesia Plan: General   Post-op Pain Management:    Induction: Intravenous  Airway Management Planned: Oral ETT  Additional Equipment:   Intra-op Plan:   Post-operative Plan: Extubation in OR  Informed Consent: I have reviewed the patients History and Physical, chart, labs and discussed the procedure including the risks, benefits and alternatives for the proposed anesthesia with the patient or authorized representative who has indicated his/her understanding and acceptance.   Dental Advisory Given  Plan Discussed with: CRNA and Surgeon  Anesthesia Plan Comments:         Anesthesia Quick Evaluation

## 2013-09-17 NOTE — Consult Note (Signed)
Reason for Consult: Left wrist pain and Left hip pain Referring Physician: [facility]  Joanne Vaughn is an 63 y.o. female.  HPI: Patient is a 63 year old female who sustained injuries when she fell at home while walking her dog yesterday immediate pain in the hip and wrist and was unable to walk or stand.  Her husband was inside in bed sick and unable to hear her yelling. She was able to crawl back to the door letting her dog back into the house.  Her dog ran in and got her husband up OOB.  She was taken to [facility] where x-rays were taken and showed that she sustained a left wrist fracture and a nondisplaced left hip fracture.  Her husband had been a patient of Dr. Gladstone Lighter in the past and wanted to come to [place], [place] for treatment.  She was transferred to [facility] and adnmitted by the [employer].  Dr. Wynelle Link was on call today on contacted by Hospitalist for consult and treatment.   Past Medical History  Diagnosis Date  . Arthritis   . Chicken pox   . Heart murmur     since childhood, benign  . Diverticulosis     Past Surgical History  Procedure Laterality Date  . Abdominal hysterectomy    . Ovaries removed  1994  . Breast biopsy  1973    benign    Family History  Problem Relation Age of Onset  . Arthritis      parent/grandparent  . Heart disease      parent/grandparent  . Diabetes Other   . Heart disease Mother 66    MI  . COPD Father   . Osteoarthritis Father   . Diabetes Sister   . Asthma Sister     Social History:  reports that she has never smoked. She does not have any smokeless tobacco history on file. She reports that she does not drink alcohol. Her drug history is not on file.  Allergies: No Known Allergies  Home Medications: Doxycycline Flonase OPravachol  Results for orders placed during the hospital encounter of 09/16/13 (from the past 48 hour(s))  CBC WITH DIFFERENTIAL     Status: Abnormal   Collection Time    09/17/13  12:35 AM      Result Value Ref Range   WBC 13.8 (*) 4.0 - 10.5 K/uL   RBC 4.07  3.87 - 5.11 MIL/uL   Hemoglobin 12.7  12.0 - 15.0 g/dL   HCT 37.0  36.0 - 46.0 %   MCV 90.9  78.0 - 100.0 fL   MCH 31.2  26.0 - 34.0 pg   MCHC 34.3  30.0 - 36.0 g/dL   RDW 12.7  11.5 - 15.5 %   Platelets 214  150 - 400 K/uL   Neutrophils Relative % 83 (*) 43 - 77 %   Neutro Abs 11.4 (*) 1.7 - 7.7 K/uL   Lymphocytes Relative 10 (*) 12 - 46 %   Lymphs Abs 1.4  0.7 - 4.0 K/uL   Monocytes Relative 6  3 - 12 %   Monocytes Absolute 0.8  0.1 - 1.0 K/uL   Eosinophils Relative 1  0 - 5 %   Eosinophils Absolute 0.1  0.0 - 0.7 K/uL   Basophils Relative 0  0 - 1 %   Basophils Absolute 0.1  0.0 - 0.1 K/uL  BASIC METABOLIC PANEL     Status: Abnormal   Collection Time    09/17/13 12:35  AM      Result Value Ref Range   Sodium 139  137 - 147 mEq/L   Potassium 3.6 (*) 3.7 - 5.3 mEq/L   Chloride 98  96 - 112 mEq/L   CO2 27  19 - 32 mEq/L   Glucose, Bld 139 (*) 70 - 99 mg/dL   BUN 17  6 - 23 mg/dL   Creatinine, Ser 0.69  0.50 - 1.10 mg/dL   Calcium 8.9  8.4 - 10.5 mg/dL   GFR calc non Af Amer >90  >90 mL/min   GFR calc Af Amer >90  >90 mL/min   Comment: (NOTE)     The eGFR has been calculated using the CKD EPI equation.     This calculation has not been validated in all clinical situations.     eGFR's persistently <90 mL/min signify possible Chronic Kidney     Disease.  PROTIME-INR     Status: None   Collection Time    09/17/13 12:35 AM      Result Value Ref Range   Prothrombin Time 12.7  11.6 - 15.2 seconds   INR 0.97  0.00 - 1.49  URINALYSIS, ROUTINE W REFLEX MICROSCOPIC     Status: Abnormal   Collection Time    09/17/13  3:15 AM      Result Value Ref Range   Color, Urine YELLOW  YELLOW   APPearance CLEAR  CLEAR   Specific Gravity, Urine 1.020  1.005 - 1.030   pH 6.0  5.0 - 8.0   Glucose, UA NEGATIVE  NEGATIVE mg/dL   Hgb urine dipstick TRACE (*) NEGATIVE   Bilirubin Urine NEGATIVE  NEGATIVE    Ketones, ur 15 (*) NEGATIVE mg/dL   Protein, ur NEGATIVE  NEGATIVE mg/dL   Urobilinogen, UA 0.2  0.0 - 1.0 mg/dL   Nitrite NEGATIVE  NEGATIVE   Leukocytes, UA NEGATIVE  NEGATIVE  URINE MICROSCOPIC-ADD ON     Status: None   Collection Time    09/17/13  3:15 AM      Result Value Ref Range   Squamous Epithelial / LPF RARE  RARE   WBC, UA 0-2  <3 WBC/hpf   RBC / HPF 3-6  <3 RBC/hpf   Bacteria, UA RARE  RARE    Dg Chest 1 View  09/17/2013   CLINICAL DATA:  Fall  EXAM: CHEST - 1 VIEW  COMPARISON:  None.  FINDINGS: The heart size and mediastinal contours are within normal limits. Both lungs are clear. The visualized skeletal structures are unremarkable.  IMPRESSION: No active disease.   Electronically Signed   By: Franchot Gallo M.D.   On: 09/17/2013 00:38   Dg Wrist Complete Left  09/17/2013   CLINICAL DATA:  Left wrist pain following a fall tonight.  EXAM: LEFT WRIST - COMPLETE 3+ VIEW  COMPARISON:  None.  FINDINGS: Comminuted and mildly impacted transverse fracture of the distal radial metaphysis with probable extension into the radiocarpal joint. Mild dorsal angulation of the distal fragment without significant displacement.  IMPRESSION: Distal radius fracture, as described above.   Electronically Signed   By: Enrique Sack M.D.   On: 09/17/2013 00:31   Dg Hip Complete Left  09/17/2013   CLINICAL DATA:  Left hip pain following a fall tonight.  EXAM: LEFT HIP - COMPLETE 2+ VIEW  COMPARISON:  None.  FINDINGS: Left intertrochanteric fracture without significant displacement or angulation.  IMPRESSION: Essentially nondisplaced left intertrochanteric fracture.   Electronically Signed   By: Richardson Landry  Joneen Caraway M.D.   On: 09/17/2013 00:29    Review of Systems  Constitutional: Negative.   HENT: Negative.   Respiratory: Negative.   Cardiovascular: Negative.   Gastrointestinal: Negative.   Musculoskeletal: Positive for falls and joint pain (left wrist and left hip).  Neurological: Negative.    Blood  pressure 121/66, pulse 77, temperature 98.2 F (36.8 C), temperature source Oral, resp. rate 16, height 5' 3"  (1.6 m), weight 65.318 kg (144 lb), SpO2 100.00%. Physical Exam  Constitutional: She appears well-developed and well-nourished.  Musculoskeletal:       Left wrist: She exhibits decreased range of motion (Left wrist splint intact,, NVI to left hand and fingers.  Moving fingers on exam) and tenderness.       Left hip: She exhibits decreased range of motion and tenderness.       Legs:   Assessment/Plan: Left Hip Nondisplaced Closed Intertrochanteric Hip Fracture Left Wrist Slight Comminuted, Minimally Angulated Closed Distal radius Fracture  Plan - with regard to the left wrist fracture, it is in decent alignment and will be treated conservative at this time.  Will monitor with serial x-rays to ensure healing and no shifting of the fracture.  If it happens to shift or loose alignment, it may require surgery in the future.  This was discussed with the patient. With regard to the left hip fracture, the situation was discussed with the patient and her family and it is recommended that she undergo surgical intervention to assist with the healing and rehab for the injury.  Risks and benefits have been discussed with the patient and will plan of surgery later today.  Mickel Crow 09/17/2013, 8:36 AM     I have seen and examined the patient and agree with the above. Will take to OR this AM for intramedullary nailing left femur. Discussed procedure, risks, potential complications and rehab course and patient elects to proceed. Will treat distal radius fracture with splint then cast as long as it does not displace.

## 2013-09-17 NOTE — Interval H&P Note (Signed)
History and Physical Interval Note:  09/17/2013 9:21 AM  Joanne Vaughn  has presented today for surgery, with the diagnosis of left intertrochanteric fracture  The various methods of treatment have been discussed with the patient and family. After consideration of risks, benefits and other options for treatment, the patient has consented to  Procedure(s): INTRAMEDULLARY (IM) NAIL INTERTROCHANTRIC (Left) as a surgical intervention .  The patient's history has been reviewed, patient examined, no change in status, stable for surgery.  I have reviewed the patient's chart and labs.  Questions were answered to the patient's satisfaction.     [name]

## 2013-09-17 NOTE — Anesthesia Postprocedure Evaluation (Signed)
Anesthesia Post Note  Patient: Joanne Vaughn  Procedure(s) Performed: Procedure(s) (LRB): INTRAMEDULLARY (IM) NAIL INTERTROCHANTRIC (Left)  Anesthesia type: General  Patient location: PACU  Post pain: Pain level controlled  Post assessment: Post-op Vital signs reviewed  Last Vitals:  Filed Vitals:   09/17/13 1140  BP:   Pulse: 69  Temp:   Resp: 14    Post vital signs: Reviewed  Level of consciousness: sedated  Complications: No apparent anesthesia complications

## 2013-09-17 NOTE — H&P (View-Only) (Signed)
Reason for Consult: Left wrist pain and Left hip pain Referring Physician: [facility]  Joanne Vaughn is an 63 y.o. female.  HPI: Patient is a 63 year old female who sustained injuries when she fell at home while walking her dog yesterday immediate pain in the hip and wrist and was unable to walk or stand.  Her husband was inside in bed sick and unable to hear her yelling. She was able to crawl back to the door letting her dog back into the house.  Her dog ran in and got her husband up OOB.  She was taken to [facility] where x-rays were taken and showed that she sustained a left wrist fracture and a nondisplaced left hip fracture.  Her husband had been a patient of Dr. Gladstone Lighter in the past and wanted to come to [place], [place] for treatment.  She was transferred to [facility] and adnmitted by the [employer].  Dr. Wynelle Link was on call today on contacted by Hospitalist for consult and treatment.   Past Medical History  Diagnosis Date  . Arthritis   . Chicken pox   . Heart murmur     since childhood, benign  . Diverticulosis     Past Surgical History  Procedure Laterality Date  . Abdominal hysterectomy    . Ovaries removed  1994  . Breast biopsy  1973    benign    Family History  Problem Relation Age of Onset  . Arthritis      parent/grandparent  . Heart disease      parent/grandparent  . Diabetes Other   . Heart disease Mother 50    MI  . COPD Father   . Osteoarthritis Father   . Diabetes Sister   . Asthma Sister     Social History:  reports that she has never smoked. She does not have any smokeless tobacco history on file. She reports that she does not drink alcohol. Her drug history is not on file.  Allergies: No Known Allergies  Home Medications: Doxycycline Flonase OPravachol  Results for orders placed during the hospital encounter of 09/16/13 (from the past 48 hour(s))  CBC WITH DIFFERENTIAL     Status: Abnormal   Collection Time    09/17/13  12:35 AM      Result Value Ref Range   WBC 13.8 (*) 4.0 - 10.5 K/uL   RBC 4.07  3.87 - 5.11 MIL/uL   Hemoglobin 12.7  12.0 - 15.0 g/dL   HCT 37.0  36.0 - 46.0 %   MCV 90.9  78.0 - 100.0 fL   MCH 31.2  26.0 - 34.0 pg   MCHC 34.3  30.0 - 36.0 g/dL   RDW 12.7  11.5 - 15.5 %   Platelets 214  150 - 400 K/uL   Neutrophils Relative % 83 (*) 43 - 77 %   Neutro Abs 11.4 (*) 1.7 - 7.7 K/uL   Lymphocytes Relative 10 (*) 12 - 46 %   Lymphs Abs 1.4  0.7 - 4.0 K/uL   Monocytes Relative 6  3 - 12 %   Monocytes Absolute 0.8  0.1 - 1.0 K/uL   Eosinophils Relative 1  0 - 5 %   Eosinophils Absolute 0.1  0.0 - 0.7 K/uL   Basophils Relative 0  0 - 1 %   Basophils Absolute 0.1  0.0 - 0.1 K/uL  BASIC METABOLIC PANEL     Status: Abnormal   Collection Time    09/17/13 12:35  AM      Result Value Ref Range   Sodium 139  137 - 147 mEq/L   Potassium 3.6 (*) 3.7 - 5.3 mEq/L   Chloride 98  96 - 112 mEq/L   CO2 27  19 - 32 mEq/L   Glucose, Bld 139 (*) 70 - 99 mg/dL   BUN 17  6 - 23 mg/dL   Creatinine, Ser 0.69  0.50 - 1.10 mg/dL   Calcium 8.9  8.4 - 10.5 mg/dL   GFR calc non Af Amer >90  >90 mL/min   GFR calc Af Amer >90  >90 mL/min   Comment: (NOTE)     The eGFR has been calculated using the CKD EPI equation.     This calculation has not been validated in all clinical situations.     eGFR's persistently <90 mL/min signify possible Chronic Kidney     Disease.  PROTIME-INR     Status: None   Collection Time    09/17/13 12:35 AM      Result Value Ref Range   Prothrombin Time 12.7  11.6 - 15.2 seconds   INR 0.97  0.00 - 1.49  URINALYSIS, ROUTINE W REFLEX MICROSCOPIC     Status: Abnormal   Collection Time    09/17/13  3:15 AM      Result Value Ref Range   Color, Urine YELLOW  YELLOW   APPearance CLEAR  CLEAR   Specific Gravity, Urine 1.020  1.005 - 1.030   pH 6.0  5.0 - 8.0   Glucose, UA NEGATIVE  NEGATIVE mg/dL   Hgb urine dipstick TRACE (*) NEGATIVE   Bilirubin Urine NEGATIVE  NEGATIVE    Ketones, ur 15 (*) NEGATIVE mg/dL   Protein, ur NEGATIVE  NEGATIVE mg/dL   Urobilinogen, UA 0.2  0.0 - 1.0 mg/dL   Nitrite NEGATIVE  NEGATIVE   Leukocytes, UA NEGATIVE  NEGATIVE  URINE MICROSCOPIC-ADD ON     Status: None   Collection Time    09/17/13  3:15 AM      Result Value Ref Range   Squamous Epithelial / LPF RARE  RARE   WBC, UA 0-2  <3 WBC/hpf   RBC / HPF 3-6  <3 RBC/hpf   Bacteria, UA RARE  RARE    Dg Chest 1 View  09/17/2013   CLINICAL DATA:  Fall  EXAM: CHEST - 1 VIEW  COMPARISON:  None.  FINDINGS: The heart size and mediastinal contours are within normal limits. Both lungs are clear. The visualized skeletal structures are unremarkable.  IMPRESSION: No active disease.   Electronically Signed   By: Franchot Gallo M.D.   On: 09/17/2013 00:38   Dg Wrist Complete Left  09/17/2013   CLINICAL DATA:  Left wrist pain following a fall tonight.  EXAM: LEFT WRIST - COMPLETE 3+ VIEW  COMPARISON:  None.  FINDINGS: Comminuted and mildly impacted transverse fracture of the distal radial metaphysis with probable extension into the radiocarpal joint. Mild dorsal angulation of the distal fragment without significant displacement.  IMPRESSION: Distal radius fracture, as described above.   Electronically Signed   By: Enrique Sack M.D.   On: 09/17/2013 00:31   Dg Hip Complete Left  09/17/2013   CLINICAL DATA:  Left hip pain following a fall tonight.  EXAM: LEFT HIP - COMPLETE 2+ VIEW  COMPARISON:  None.  FINDINGS: Left intertrochanteric fracture without significant displacement or angulation.  IMPRESSION: Essentially nondisplaced left intertrochanteric fracture.   Electronically Signed   By: Richardson Landry  Joneen Caraway M.D.   On: 09/17/2013 00:29    Review of Systems  Constitutional: Negative.   HENT: Negative.   Respiratory: Negative.   Cardiovascular: Negative.   Gastrointestinal: Negative.   Musculoskeletal: Positive for falls and joint pain (left wrist and left hip).  Neurological: Negative.    Blood  pressure 121/66, pulse 77, temperature 98.2 F (36.8 C), temperature source Oral, resp. rate 16, height 5' 3"  (1.6 m), weight 65.318 kg (144 lb), SpO2 100.00%. Physical Exam  Constitutional: She appears well-developed and well-nourished.  Musculoskeletal:       Left wrist: She exhibits decreased range of motion (Left wrist splint intact,, NVI to left hand and fingers.  Moving fingers on exam) and tenderness.       Left hip: She exhibits decreased range of motion and tenderness.       Legs:   Assessment/Plan: Left Hip Nondisplaced Closed Intertrochanteric Hip Fracture Left Wrist Slight Comminuted, Minimally Angulated Closed Distal radius Fracture  Plan - with regard to the left wrist fracture, it is in decent alignment and will be treated conservative at this time.  Will monitor with serial x-rays to ensure healing and no shifting of the fracture.  If it happens to shift or loose alignment, it may require surgery in the future.  This was discussed with the patient. With regard to the left hip fracture, the situation was discussed with the patient and her family and it is recommended that she undergo surgical intervention to assist with the healing and rehab for the injury.  Risks and benefits have been discussed with the patient and will plan of surgery later today.  Mickel Crow 09/17/2013, 8:36 AM     I have seen and examined the patient and agree with the above. Will take to OR this AM for intramedullary nailing left femur. Discussed procedure, risks, potential complications and rehab course and patient elects to proceed. Will treat distal radius fracture with splint then cast as long as it does not displace.

## 2013-09-17 NOTE — Transfer of Care (Signed)
Immediate Anesthesia Transfer of Care Note  Patient: Joanne Vaughn  Procedure(s) Performed: Procedure(s): INTRAMEDULLARY (IM) NAIL INTERTROCHANTRIC (Left)  Patient Location: PACU  Anesthesia Type:General  Level of Consciousness: sedated  Airway & Oxygen Therapy: Patient Spontanous Breathing and Patient connected to face mask oxygen  Post-op Assessment: Report given to PACU RN and Post -op Vital signs reviewed and stable  Post vital signs: stable  Complications: No apparent anesthesia complications

## 2013-09-17 NOTE — Progress Notes (Signed)
Patient seen briefly. Admitted from any Hospital for trip and fall with left arm injury and left hip fracture.   Discussed with orthopedic surgery Dr. Ricki Rodriguez, being taken to the OR later today. Moderate risk for adverse cardiopulmonary outcome which was explained clearly to patient and family bedside they accept the risk and want to proceed with surgical intervention. Only known medical issue is her heart murmur.   Weight bearing, DVT prophylaxis per orthopedics.    Low potassium will be replaced. Will check CBC BMP in the morning.

## 2013-09-17 NOTE — Op Note (Signed)
  OPERATIVE REPORT  PREOPERATIVE DIAGNOSIS: Left intertrochanteric femur fracture.   POSTOP DIAGNOSIS: Left intertrochanteric femur fracture.   PROCEDURE: Intramedullary nailing, Left intertrochanteric femur  fracture.   SURGEON: Gaynelle Arabian, M.D.   ASSISTANT: Alexzandrew L. Perkins, P.A.C.   ANESTHESIA:General  Estimated BLOOD LOSS: minimal  DRAINS: None.   COMPLICATIONS:   None  CONDITION: -PACU - hemodynamically stable.    CLINICAL NOTE: Joanne Vaughn is an 63 y.o. female, who had a fall last night   sustaining a non-displaced  Left intertrochanteric femur fracture. She has been cleared medically and presents for operative fixation.    PROCEDURE IN DETAIL: After successful administration of  General,  the patient was placed on the fracture table with Left lower extremity in a well-padded traction boot,  Right lower extremity in a well-padded leg holder. Under fluoroscopic guidance, the fracture was reduced. The traction was locked in this position. Thigh was prepped  and draped in the usual sterile fashion. The guide pin for the Biomet  Affixus was then passed percutaneously to the tip of the greater  trochanter, was entered into the femoral canal. It was passed into the  canal. The small incision was made and the starter reamer passed over  the guide pin. This was then removed. The nail which was an 11 mm  diameter short trochanteric nail with 130 degrees angle was attached to  the external guide and then passed into the femoral canal, impacted to  the appropriate depth in the canal, then we used the external guide to  place the lag screw. Through the external guide, a guide pin was  passed. Small incision made, and the guide pin was in the center of the  femoral head on the AP and slightly center to posterior on the lateral.  Length was 90 mm. Triple reamer was passed over the guide pin. 90 mm  lag screw was placed. It was then locked down with a locking screw.   Through the external guide, the distal interlock was placed through the  static hole and this was 34 mm in length with excellent bicortical  purchase. The external guide was then removed. Hardware was in good  position and fracture was well reduced. Wound was copiously irrigated with saline  solution, and  closed deep with interrupted 1 Vicryl, subcu  interrupted 2-0 Vicryl, subcuticular running 4-0 Monocryl. Incision was  cleaned and dried and sterile dressings applied. She was awakened and  transported to recovery in stable condition.   Dione Plover Andie Mungin, MD    09/17/2013, 10:49 AM

## 2013-09-18 LAB — CBC
HEMATOCRIT: 30.8 % — AB (ref 36.0–46.0)
Hemoglobin: 10.3 g/dL — ABNORMAL LOW (ref 12.0–15.0)
MCH: 30.6 pg (ref 26.0–34.0)
MCHC: 33.4 g/dL (ref 30.0–36.0)
MCV: 91.4 fL (ref 78.0–100.0)
Platelets: 171 10*3/uL (ref 150–400)
RBC: 3.37 MIL/uL — ABNORMAL LOW (ref 3.87–5.11)
RDW: 13.1 % (ref 11.5–15.5)
WBC: 10.3 10*3/uL (ref 4.0–10.5)

## 2013-09-18 LAB — BASIC METABOLIC PANEL
BUN: 10 mg/dL (ref 6–23)
CO2: 26 mEq/L (ref 19–32)
Calcium: 8.2 mg/dL — ABNORMAL LOW (ref 8.4–10.5)
Chloride: 103 mEq/L (ref 96–112)
Creatinine, Ser: 0.69 mg/dL (ref 0.50–1.10)
Glucose, Bld: 130 mg/dL — ABNORMAL HIGH (ref 70–99)
Potassium: 4.5 mEq/L (ref 3.7–5.3)
SODIUM: 137 meq/L (ref 137–147)

## 2013-09-18 NOTE — Progress Notes (Signed)
Patient Demographics  Joanne Vaughn, is a 63 y.o. female, DOB - February 24, 1951, UMP:[id number]  Admit date - 09/16/2013   Admitting Physician Oswald Hillock, MD  Outpatient Primary MD for the patient is Lucretia Kern., DO  LOS - 2   Chief Complaint  Patient presents with  . Fall  . Hip Pain        Assessment & Plan     1. Trip and fall causing left distal radius and left femoral intertrochanteric fracture - status post open reduction internal fixation by Dr. Ricki Rodriguez on 09/17/2013, she is tolerated the procedure well, weight bearing as tolerated, Lovenox, PT. Possible discharge in the morning. Left distal radial fracture to be treated conservatively arm in splint. Outpatient monitoring by orthopedics post discharge. Likely discharge in the morning.    2. Chronic murmur. No acute issues likely mitral valve prolapse outpatient followup with PCP.    3. Low potassium on admission. Replaced in stable     Code Status: Full  Family Communication: Sister  Disposition Plan: Home   Procedures open reduction internal fixation of left hip by orthopedics physician Dr. Ricki Rodriguez on 09/17/2013   Consults  orthopedics   Medications  Scheduled Meds: . docusate sodium  100 mg Oral BID  . enoxaparin (LOVENOX) injection  40 mg Subcutaneous Q24H  . simvastatin  20 mg Oral q1800   Continuous Infusions:  PRN Meds:.acetaminophen, acetaminophen, bisacodyl, menthol-cetylpyridinium, methocarbamol (ROBAXIN) IV, methocarbamol, metoCLOPramide (REGLAN) injection, metoCLOPramide, morphine injection, ondansetron (ZOFRAN) IV, ondansetron, oxyCODONE, phenol, polyethylene glycol, traMADol, zolpidem  DVT Prophylaxis  Lovenox   Lab Results  Component Value Date   PLT 171 09/18/2013    Antibiotics      Anti-infectives   Start     Dose/Rate Route Frequency Ordered Stop   09/17/13 1600  ceFAZolin (ANCEF) IVPB 2 g/50 mL premix     2 g 100 mL/hr over 30 Minutes Intravenous Every 6 hours 09/17/13 1216 09/17/13 2133   09/17/13 0930  [MAR Hold]  ceFAZolin (ANCEF) IVPB 2 g/50 mL premix     (On MAR Hold since 09/17/13 0917)   2 g 100 mL/hr over 30 Minutes Intravenous  Once 09/17/13 0852 09/17/13 1000          Subjective:   Joanne Vaughn today has, No headache, No chest pain, No abdominal pain - No Nausea, No new weakness tingling or numbness, No Cough - SOB.    Objective:   Filed Vitals:   09/18/13 0000 09/18/13 0202 09/18/13 0400 09/18/13 0530  BP:  100/61  105/57  Pulse:  75  62  Temp:  98.2 F (36.8 C)  97.3 F (36.3 C)  TempSrc:  Oral  Oral  Resp: 16 16 16 16   Height:      Weight:      SpO2: 100% 100% 100% 100%    Wt Readings from Last 3 Encounters:  09/17/13 65.318 kg (144 lb)  09/17/13 65.318 kg (144 lb)  02/28/13 61.236 kg (135 lb)     Intake/Output Summary (Last 24 hours) at 09/18/13 0933 Last data filed at 09/18/13 0657  Gross per 24 hour  Intake   2560 ml  Output   2025 ml  Net    535 ml  Physical Exam  Awake Alert, Oriented X 3, No new F.N deficits, Normal affect [place].AT,PERRAL Supple Neck,No JVD, No cervical lymphadenopathy appriciated.  Symmetrical Chest wall movement, Good air movement bilaterally, CTAB RRR,No Gallops,Rubs or new Murmurs, No Parasternal Heave +ve B.Sounds, Abd Soft, Non tender, No organomegaly appriciated, No rebound - guarding or rigidity. No Cyanosis, Clubbing or edema, No new Rash or bruise   Left arm in splint and bandage    Data Review   Micro Results Recent Results (from the past 240 hour(s))  MRSA PCR SCREENING     Status: None   Collection Time    09/17/13  9:01 AM      Result Value Ref Range Status   MRSA by PCR NEGATIVE  NEGATIVE Final   Comment:            The GeneXpert MRSA Assay (FDA     approved for  NASAL specimens     only), is one component of a     comprehensive MRSA colonization     surveillance program. It is not     intended to diagnose MRSA     infection nor to guide or     monitor treatment for     MRSA infections.    Radiology Reports Dg Chest 1 View  09/17/2013   CLINICAL DATA:  Fall  EXAM: CHEST - 1 VIEW  COMPARISON:  None.  FINDINGS: The heart size and mediastinal contours are within normal limits. Both lungs are clear. The visualized skeletal structures are unremarkable.  IMPRESSION: No active disease.   Electronically Signed   By: Franchot Gallo M.D.   On: 09/17/2013 00:38   Dg Wrist Complete Left  09/17/2013   CLINICAL DATA:  Left wrist pain following a fall tonight.  EXAM: LEFT WRIST - COMPLETE 3+ VIEW  COMPARISON:  None.  FINDINGS: Comminuted and mildly impacted transverse fracture of the distal radial metaphysis with probable extension into the radiocarpal joint. Mild dorsal angulation of the distal fragment without significant displacement.  IMPRESSION: Distal radius fracture, as described above.   Electronically Signed   By: Enrique Sack M.D.   On: 09/17/2013 00:31   Dg Hip Complete Left  09/17/2013   CLINICAL DATA:  Left hip pain following a fall tonight.  EXAM: LEFT HIP - COMPLETE 2+ VIEW  COMPARISON:  None.  FINDINGS: Left intertrochanteric fracture without significant displacement or angulation.  IMPRESSION: Essentially nondisplaced left intertrochanteric fracture.   Electronically Signed   By: Enrique Sack M.D.   On: 09/17/2013 00:29   Dg Femur Left  09/17/2013   CLINICAL DATA:  Intramedullary rod placement.  EXAM: LEFT FEMUR - 2 VIEW  COMPARISON:  DG HIP COMPLETE*L* dated 09/17/2013  FINDINGS: Three intraoperative views. Placement of a dynamic screw across the previously described intertrochanteric femur fracture. No acute hardware complication.  IMPRESSION: Internal fixation of intertrochanteric femur fracture, without acute complication.   Electronically Signed    By: Abigail Miyamoto M.D.   On: 09/17/2013 13:40   Dg C-arm 1-60 Min-no Report  09/17/2013   CLINICAL DATA: IM nail left hip   C-ARM 1-60 MINUTES  Fluoroscopy was utilized by the requesting physician.  No radiographic  interpretation.     CBC  Recent Labs Lab 09/17/13 0035 09/18/13 0531  WBC 13.8* 10.3  HGB 12.7 10.3*  HCT 37.0 30.8*  PLT 214 171  MCV 90.9 91.4  MCH 31.2 30.6  MCHC 34.3 33.4  RDW 12.7 13.1  LYMPHSABS 1.4  --  MONOABS 0.8  --   EOSABS 0.1  --   BASOSABS 0.1  --     Chemistries   Recent Labs Lab 09/17/13 0035 09/18/13 0531  NA 139 137  K 3.6* 4.5  CL 98 103  CO2 27 26  GLUCOSE 139* 130*  BUN 17 10  CREATININE 0.69 0.69  CALCIUM 8.9 8.2*   ------------------------------------------------------------------------------------------------------------------ estimated creatinine clearance is 67.2 ml/min (by C-G formula based on Cr of 0.69). ------------------------------------------------------------------------------------------------------------------ No results found for this basename: HGBA1C,  in the last 72 hours ------------------------------------------------------------------------------------------------------------------ No results found for this basename: CHOL, HDL, LDLCALC, TRIG, CHOLHDL, LDLDIRECT,  in the last 72 hours ------------------------------------------------------------------------------------------------------------------ No results found for this basename: TSH, T4TOTAL, FREET3, T3FREE, THYROIDAB,  in the last 72 hours ------------------------------------------------------------------------------------------------------------------ No results found for this basename: VITAMINB12, FOLATE, FERRITIN, TIBC, IRON, RETICCTPCT,  in the last 72 hours  Coagulation profile  Recent Labs Lab 09/17/13 0035  INR 0.97    No results found for this basename: DDIMER,  in the last 72 hours  Cardiac Enzymes No results found for this basename: CK,  CKMB, TROPONINI, MYOGLOBIN,  in the last 168 hours ------------------------------------------------------------------------------------------------------------------ No components found with this basename: POCBNP,      Time Spent in minutes  35   Lala Lund K M.D on 09/18/2013 at 9:33 AM  Between 7am to 7pm - Pager - [phone]  After 7pm go to [email or web address] - password [email or web address]  And look for the night coverage person covering for me after hours  [facility] Office  [phone]

## 2013-09-18 NOTE — Evaluation (Signed)
Physical Therapy Evaluation Patient Details Name: Joanne Vaughn MRN: [id number] DOB: 1950-09-24 Today's Date: 09/18/2013 Time: 9147-8295 PT Time Calculation (min): 28 min  PT Assessment / Plan / Recommendation History of Present Illness  s/p IM nail hip and  Left distal radius fx  Clinical Impression  Pt motivated to do therapy and go home tomorrow, limited by nausea during amb    PT Assessment  Patient needs continued PT services    Follow Up Recommendations  Home health PT    Does the patient have the potential to tolerate intense rehabilitation      Barriers to Discharge        Equipment Recommendations  Other (comment) (platform attachment for her RW)    Recommendations for Other Services     Frequency Min 6X/week    Precautions / Restrictions Precautions Precautions: Fall Restrictions LLE Weight Bearing: Weight bearing as tolerated   Pertinent Vitals/Pain Painful Left hip but premedicated and able to do therapy      Mobility  Bed Mobility Overal bed mobility: Needs Assistance Bed Mobility: Supine to Sit Supine to sit: Min assist General bed mobility comments: cues for technique, incr time Transfers Overall transfer level: Needs assistance Equipment used: Rolling walker (2 wheeled) Transfers: Sit to/from Stand Sit to Stand: Min assist;Mod assist General transfer comment: cues for hand placement and wt shift  Ambulation/Gait Ambulation/Gait assistance: Min assist Ambulation Distance (Feet): 30 Feet Assistive device: Left platform walker Gait Pattern/deviations: Step-to pattern;Antalgic General Gait Details: cues for sequence and wt shift to UEs     Exercises     PT Diagnosis: Difficulty walking  PT Problem List: Decreased activity tolerance;Decreased mobility;Decreased knowledge of precautions;Decreased range of motion PT Treatment Interventions: DME instruction;Gait training;Functional mobility training;Therapeutic activities;Therapeutic  exercise;Patient/family education     PT Goals(Current goals can be found in the care plan section) Acute Rehab PT Goals Patient Stated Goal: home tomorow PT Goal Formulation: With patient Time For Goal Achievement: 09/21/13 Potential to Achieve Goals: Good  Visit Information  Last PT Received On: 09/18/13 History of Present Illness: s/p IM nail hip and  Left distal radius fx       Prior [place] expects to be discharged to:: Private residence Living Arrangements: Spouse/significant other Type of Home: House Home Access: Stairs to enter [employer] of Steps: 1 Home Layout: Two level;Able to live on main level with bedroom/bathroom Alternate Level Stairs-Number of Steps: pt's bedroom is upstairs but she can stay in downstairs bedroom Home Equipment: Gilford Rile - 2 wheels Additional Comments: dtr-in-law coming to stay 24/7; will arrive in few days from [place]  Prior Function Level of Independence: Independent Communication Communication: No difficulties    Cognition  Cognition Arousal/Alertness: Awake/alert Behavior During Therapy: WFL for tasks assessed/performed Overall Cognitive Status: Within Functional Limits for tasks assessed    Extremity/Trunk Assessment Upper Extremity Assessment Upper Extremity Assessment: Defer to OT evaluation;LUE deficits/detail LUE: Unable to fully assess due to pain;Unable to fully assess due to immobilization Lower Extremity Assessment Lower Extremity Assessment: LLE deficits/detail LLE Deficits / Details: limited by pain but AAROM WFL   Balance    End of Session PT - End of Session Equipment Utilized During Treatment: Gait belt Patient left: in chair;with call bell/phone within reach;with family/visitor present Nurse Communication: Mobility status  GP     [facility] 09/18/2013, 1:21 PM

## 2013-09-18 NOTE — Progress Notes (Signed)
09/18/13 1500  PT Visit Information  Last PT Received On 09/18/13  Assistance Needed +1  History of Present Illness s/p IM nail hip and  Left distal radius fx  PT Time Calculation  PT Start Time 1402  PT Stop Time 1427  PT Time Calculation (min) 25 min  Subjective Data  Patient Stated Goal home tomorow  Precautions  Precautions Fall  Restrictions  LLE Weight Bearing WBAT  Cognition  Arousal/Alertness Awake/alert  Behavior During Therapy WFL for tasks assessed/performed  Overall Cognitive Status Within Functional Limits for tasks assessed  Bed Mobility  Overal bed mobility Needs Assistance  Bed Mobility Sit to Supine  Sit to supine Min assist  General bed mobility comments cues for technique, incr time  Transfers  Overall transfer level Needs assistance  Equipment used Left platform walker  Transfers Sit to/from Stand  Sit to Stand Min assist  General transfer comment cues for hand placement and wt shift   Ambulation/Gait  Ambulation/Gait assistance Min assist;Min guard  Ambulation Distance (Feet) 140 Feet  Assistive device Left platform walker  Gait Pattern/deviations Step-to pattern;Antalgic  General Gait Details cues for sequence and wt shift to UEs   General Exercises - Lower Extremity  Ankle Circles/Pumps AROM;Both;10 reps  Quad Sets AROM;Both;10 reps  Short [place] [employer];Left;10 reps  Heel Slides AROM;AAROM;10 reps;Left  Hip ABduction/ADduction AROM;AAROM;Left;10 reps  PT - End of Session  Equipment Utilized During Treatment Gait belt  Activity Tolerance Patient tolerated treatment well  Patient left in bed;with call bell/phone within reach;with family/visitor present  Nurse Communication Mobility status  PT - Assessment/Plan  PT Plan Current plan remains appropriate  PT Frequency Min 6X/week  Follow Up Recommendations Home health PT  PT equipment Other (comment)  PT Goal Progression  Progress towards PT goals Progressing toward goals  Acute Rehab PT  Goals  PT Goal Formulation With patient  Time For Goal Achievement 09/21/13  Potential to Achieve Goals Good  PT General Charges  $$ ACUTE PT VISIT 1 Procedure  PT Treatments  $Gait Training 8-22 mins  $Therapeutic Exercise 8-22 mins

## 2013-09-18 NOTE — Plan of Care (Signed)
Problem: Phase III Progression Outcomes Goal: Anticoagulant follow-up in place Outcome: Not Applicable Date Met:  71/25/24 lovenox

## 2013-09-18 NOTE — Progress Notes (Signed)
Moved to 1611 for unit maintenance. Stephania Macfarlane, [employer]

## 2013-09-18 NOTE — Progress Notes (Signed)
   Subjective: 1 Day Post-Op Procedure(s) (LRB): INTRAMEDULLARY (IM) NAIL INTERTROCHANTRIC (Left) Patient reports pain as mild.   We will start therapy today.  Plan is to go Home after hospital stay.  Objective: Vital signs in last 24 hours: Temp:  [97.3 F (36.3 C)-98.5 F (36.9 C)] 97.3 F (36.3 C) (03/15 0530) Pulse Rate:  [61-85] 62 (03/15 0530) Resp:  [12-16] 16 (03/15 0530) BP: (100-157)/(57-76) 105/57 mmHg (03/15 0530) SpO2:  [98 %-100 %] 100 % (03/15 0530)  Intake/Output from previous day:  Intake/Output Summary (Last 24 hours) at 09/18/13 0829 Last data filed at 09/18/13 0657  Gross per 24 hour  Intake   2560 ml  Output   2125 ml  Net    435 ml    Intake/Output this shift:    Labs:  Recent Labs  09/17/13 0035 09/18/13 0531  HGB 12.7 10.3*    Recent Labs  09/17/13 0035 09/18/13 0531  WBC 13.8* 10.3  RBC 4.07 3.37*  HCT 37.0 30.8*  PLT 214 171    Recent Labs  09/17/13 0035 09/18/13 0531  NA 139 137  K 3.6* 4.5  CL 98 103  CO2 27 26  BUN 17 10  CREATININE 0.69 0.69  GLUCOSE 139* 130*  CALCIUM 8.9 8.2*    Recent Labs  09/17/13 0035  INR 0.97    EXAM General - Patient is Alert, Appropriate and Oriented Extremity - Neurologically intact Neurovascular intact No cellulitis present LUE exam normal with long arm splint in place Dressing - dressing C/D/I Motor Function - intact, moving foot and toes well on exam.    Past Medical History  Diagnosis Date  . Arthritis   . Chicken pox   . Heart murmur     since childhood, benign  . Diverticulosis     Assessment/Plan: 1 Day Post-Op Procedure(s) (LRB): INTRAMEDULLARY (IM) NAIL INTERTROCHANTRIC (Left) Principal Problem:   Hip fracture Active Problems:   Osteoarthritis of knees and back   Advance diet Up with therapy D/C IV fluids Plan for discharge tomorrow  DVT Prophylaxis - Lovenox Weight Bearing As Tolerated left Leg Begin Therapy    [name]

## 2013-09-19 ENCOUNTER — Encounter (HOSPITAL_COMMUNITY): Payer: Self-pay | Admitting: Orthopedic Surgery

## 2013-09-19 LAB — CBC
HCT: 32.2 % — ABNORMAL LOW (ref 36.0–46.0)
HEMOGLOBIN: 10.5 g/dL — AB (ref 12.0–15.0)
MCH: 30.5 pg (ref 26.0–34.0)
MCHC: 32.6 g/dL (ref 30.0–36.0)
MCV: 93.6 fL (ref 78.0–100.0)
Platelets: 159 10*3/uL (ref 150–400)
RBC: 3.44 MIL/uL — ABNORMAL LOW (ref 3.87–5.11)
RDW: 13.4 % (ref 11.5–15.5)
WBC: 9.3 10*3/uL (ref 4.0–10.5)

## 2013-09-19 LAB — BASIC METABOLIC PANEL
BUN: 10 mg/dL (ref 6–23)
CHLORIDE: 100 meq/L (ref 96–112)
CO2: 26 mEq/L (ref 19–32)
Calcium: 8.1 mg/dL — ABNORMAL LOW (ref 8.4–10.5)
Creatinine, Ser: 0.67 mg/dL (ref 0.50–1.10)
GLUCOSE: 128 mg/dL — AB (ref 70–99)
Potassium: 4.7 mEq/L (ref 3.7–5.3)
SODIUM: 137 meq/L (ref 137–147)

## 2013-09-19 MED ORDER — ASPIRIN EC 325 MG PO TBEC: 325.0000 mg | DELAYED_RELEASE_TABLET | Freq: Every day | ORAL | Status: DC

## 2013-09-19 MED ORDER — METHOCARBAMOL 500 MG PO TABS: 500.0000 mg | ORAL_TABLET | Freq: Four times a day (QID) | ORAL | Status: DC | PRN

## 2013-09-19 MED ORDER — OXYCODONE HCL 5 MG PO TABS: 5.0000 mg | ORAL_TABLET | Freq: Four times a day (QID) | ORAL | Status: DC | PRN

## 2013-09-19 MED ORDER — ENOXAPARIN SODIUM 40 MG/0.4ML ~~LOC~~ SOLN: 40.0000 mg | SUBCUTANEOUS | Status: DC

## 2013-09-19 MED ORDER — OXYCODONE HCL 5 MG PO TABS: 5.0000 mg | ORAL_TABLET | ORAL | Status: DC | PRN

## 2013-09-19 MED ORDER — DSS 100 MG PO CAPS: 100.0000 mg | ORAL_CAPSULE | Freq: Two times a day (BID) | ORAL | Status: DC

## 2013-09-19 NOTE — Progress Notes (Signed)
   CARE MANAGEMENT NOTE 09/19/2013  Patient:  Joanne Vaughn, Joanne Vaughn   Account Number:  [id number]  Date Initiated:  09/19/2013  Documentation initiated by:  [facility]  Subjective/Objective Assessment:   /p IM nail hip and  Left distal radius fx     Action/Plan:   Anticipated DC Date:  09/19/2013   Anticipated DC Plan:  [place]  CM consult      [facility] Choice  HOME HEALTH   Choice offered to / List presented to:  C-1 Patient   DME arranged  Vassie Moselle      DME agency  [place] arranged  [place] RN      [place].   Status of service:  Completed, signed off Medicare Important Message given?   (If response is "NO", the following Medicare IM given date fields will be blank) Date Medicare IM given:   Date Additional Medicare IM given:    Discharge Disposition:  [place]  Per UR Regulation:    If discussed at Long Length of Stay Meetings, dates discussed:    Comments:  09/19/2013 1100 NCM spoke to pt and offered choice for [facility]. Pt agreeable to [facility] for HH. Pt states she has RW. RW needs a extension for left arm. Contacted [facility] for DME, platform RW. Contacted [facility] for [facility]. Jonnie Finner RN CCM Case Mgmt phone [phone]

## 2013-09-19 NOTE — Progress Notes (Addendum)
   Subjective: 2 Days Post-Op Procedure(s) (LRB): INTRAMEDULLARY (IM) NAIL INTERTROCHANTRIC (Left) Patient reports pain as mild.   Patient seen in rounds by Dr. Wynelle Link. Patient is well, and has had no acute complaints or problems Patient is ready to go home when cleared by medicine.  Objective: Vital signs in last 24 hours: Temp:  [98.1 F (36.7 C)-99.1 F (37.3 C)] 99.1 F (37.3 C) (03/16 0439) Pulse Rate:  [76-85] 82 (03/16 0439) Resp:  [16-20] 16 (03/16 0439) BP: (92-112)/(35-67) 112/67 mmHg (03/16 0439) SpO2:  [93 %-100 %] 93 % (03/16 0439)  Intake/Output from previous day:  Intake/Output Summary (Last 24 hours) at 09/19/13 0717 Last data filed at 09/19/13 0620  Gross per 24 hour  Intake    240 ml  Output    850 ml  Net   -610 ml    Intake/Output this shift:    Labs:  Recent Labs  09/17/13 0035 09/18/13 0531 09/19/13 0420  HGB 12.7 10.3* 10.5*    Recent Labs  09/18/13 0531 09/19/13 0420  WBC 10.3 9.3  RBC 3.37* 3.44*  HCT 30.8* 32.2*  PLT 171 159    Recent Labs  09/18/13 0531 09/19/13 0420  NA 137 137  K 4.5 4.7  CL 103 100  CO2 26 26  BUN 10 10  CREATININE 0.69 0.67  GLUCOSE 130* 128*  CALCIUM 8.2* 8.1*    Recent Labs  09/17/13 0035  INR 0.97    EXAM: General - Patient is Alert, Appropriate and Oriented Extremity - Neurovascular intact Sensation intact distally to left upper and lower extremity. Incision - clean, dry, no drainage Motor Function - intact, moving fingersand toes well on exam.   Assessment/Plan: 2 Days Post-Op Procedure(s) (LRB): INTRAMEDULLARY (IM) NAIL INTERTROCHANTRIC (Left) Procedure(s) (LRB): INTRAMEDULLARY (IM) NAIL INTERTROCHANTRIC (Left) Past Medical History  Diagnosis Date  . Arthritis   . Chicken pox   . Heart murmur     since childhood, benign  . Diverticulosis    Principal Problem:   Hip fracture Active Problems:   Osteoarthritis of knees and back  Estimated body mass index is 25.51  kg/(m^2) as calculated from the following:   Height as of this encounter: 5\' 3"  (1.6 m).   Weight as of this encounter: 65.318 kg (144 lb). Up with therapy Discharge home with home health when cleared by medicine.   May go home from Ortho standpoint  Diet - Regular diet Follow up - next Wednesday March 25th.  Call for appointment time. Activity - WBAT to left leg. NWB to left arm. Disposition - Home Condition Upon Discharge - Pending at this time.  Improved from Ortho standpoint. D/C Meds - Oxycodone, Robaxin, Lovenox and then Aspirin.  RX'S placed on chart in anticipation of discharge. DVT Prophylaxis - Aspirin and Lovenox - Lovenox for eight more days and then switch over to a full dose 325 mg Aspirin daily for four more weeks.  PERKINS, ALEXZANDREW 09/19/2013, 7:17 AM

## 2013-09-19 NOTE — Discharge Instructions (Addendum)
Follow with Primary MD Colin Benton R., DO in 7 days   Get CBC, CMP, checked 7 days by Primary MD and again as instructed by your Primary MD. Get a 2 view Chest X ray done next visit if you had Pneumonia of Lung problems at the Hospital.   Activity: As tolerated with Full fall precautions use walker/cane & assistance as needed, keep left arm in splint - do not use L arm till told to do so by Dr Ricki Rodriguez   Disposition Home    Diet: Heart Healthy    For Heart failure patients - Check your Weight same time everyday, if you gain over 2 pounds, or you develop in leg swelling, experience more shortness of breath or chest pain, call your Primary MD immediately. Follow Cardiac Low Salt Diet and 1.8 lit/day fluid restriction.   On your next visit with her primary care physician please Get Medicines reviewed and adjusted.  Please request your Prim.MD to go over all Hospital Tests and Procedure/Radiological results at the follow up, please get all Hospital records sent to your Prim MD by signing hospital release before you go home.   If you experience worsening of your admission symptoms, develop shortness of breath, life threatening emergency, suicidal or homicidal thoughts you must seek medical attention immediately by calling 911 or calling your MD immediately  if symptoms less severe.  You Must read complete instructions/literature along with all the possible adverse reactions/side effects for all the Medicines you take and that have been prescribed to you. Take any new Medicines after you have completely understood and accpet all the possible adverse reactions/side effects.   Do not drive and provide baby sitting services if your were admitted for syncope or siezures until you have seen by Primary MD or a Neurologist and advised to do so again.  Do not drive when taking Pain medications.    Do not take more than prescribed Pain, Sleep and Anxiety Medications  Special Instructions: If you  have smoked or chewed Tobacco  in the last 2 yrs please stop smoking, stop any regular Alcohol  and or any Recreational drug use.  Wear Seat belts while driving.   Please note  You were cared for by a hospitalist during your hospital stay. If you have any questions about your discharge medications or the care you received while you were in the hospital after you are discharged, you can call the unit and asked to speak with the hospitalist on call if the hospitalist that took care of you is not available. Once you are discharged, your primary care physician will handle any further medical issues. Please note that NO REFILLS for any discharge medications will be authorized once you are discharged, as it is imperative that you return to your primary care physician (or establish a relationship with a primary care physician if you do not have one) for your aftercare needs so that they can reassess your need for medications and monitor your lab values.      Hip Fracture, Open Reduction and Internal Fixation (ORIF) A hip fracture, or broken hip, can happen to anyone. To fix it, surgery is usually needed. One method is called open reduction and internal fixation, or ORIF for short. "Open reduction" means an incision (cut) is made to open the fracture area. This lets the surgeon see the broken bone. The bone pieces will be put back together. Some type of hardware will be used to hold the bones in place. That is  called "internal fixation." Screws, pins, rods or a metal plate might be used. More than 250,000 people in the [place] break a hip every year. Nearly all of them are treated successfully with surgery. LET YOUR CAREGIVER KNOW ABOUT : On the day of your surgery, your caregivers will need to know the last time you had anything to eat or drink. This includes water, gum and candy. Also make sure they know about:   Any allergies.  All medications you are taking, including:  Herbs, eyedrops,  over-the-counter medications and creams.  Blood thinners (anticoagulants), aspirin or other drugs that could affect blood clotting.  Use of steroids (by mouth or as creams).  Previous problems with anesthesia, including local anesthetics.  Possibility of pregnancy, if this applies.  Any history of blood clots.  Any history of bleeding or other blood problems.  Previous surgery.  Family history of anesthetic complications  Smoking history.  Any recent symptoms of colds or infections.  Other health problems. RISKS AND COMPLICATIONS  All operations have some risk. Being unhealthy increases risks. That is why you want to be as healthy as possible before this surgery. Possible problems after ORIF may include:  Blood clots.  Bleeding.  Infection near the incision.  Lung infection (pneumonia).  Pain that continues after the operation.  Trouble walking. Some people may need to continue using a walker. BEFORE THE PROCEDURE You should be as healthy as possible before surgery for a broken hip. Sometimes this means waiting until other health problems are addressed. Then, the operation can be scheduled. To find out if you are ready for surgery:  A medical evaluation will be done. This examination will include checking your heart and lungs.  Imaging tests. These let the surgeon see what the fracture looks like. They could include:  X-rays to find exactly where the break is.  Computed tomography (CT) scan. A CT scan takes pictures using X-rays and a computer. This can give a better view of the broken hip.  Magnetic resonance imaging (MRI scan). It uses a magnet, radio waves and a computer. It may show a hidden fracture that cannot be seen on X-ray or CT.  Blood tests.  Urine test. It is possible to have a urinary tract infection and not know it.  Talking with an anesthesiologist. This is the person who will be in charge of the anesthesia (medication to stop the pain) during the  surgery. An ORIF procedure usually is done with general anesthesia (being asleep during surgery), or a spinal anesthesia is used to make you numb (no feeling) from the waist down but awake during the operation. Ask your surgeon if there is an advantage to one type of anesthetic over the other. You will need to stop taking certain medicines.  The admitting physician will have you stop using aspirin and non-steroidal anti-inflammatory drugs (NSAIDs) for pain relief. This includes prescription drugs and over-the-counter drugs such as ibuprofen and naproxen.  If you take blood-thinners, ask your healthcare provider when you should stop taking them. You will have to give what is called informed consent. This requires signing a legal paper that gives permission for the surgery. To give informed consent:  You must understand how the procedure is done and why.  You must be told all the risks and benefits of the procedure.  You must sign the consent. Or, a legal guardian can do this.  Signing should be witnessed by a healthcare professional. The day before the surgery, eat only a  light dinner. Then, do not eat or drink anything for at least 8 hours before the surgery. Ask if it is OK to take any needed medicines with a sip of water. PROCEDURE The preparation:  Small monitors will be put on your body. They are used to check your heart, blood pressure and oxygen level.  You will be given an intravenous line (IV). A needle will be inserted in your arm. It is hooked to a plastic tube. Medication will be able to flow directly into your body through the IV.  You will be given anesthesia.  For general anesthesia, the anesthesiologist may hold a mask gently over your face. You will breathe in gases that will make you sleep. A tube also might be put in your throat. This would let you continue to get anesthesia during the procedure.  For spinal anesthesia, a drug will be injected (shot) into the spinal cord  area. This will make the body numb from the waist down.  The hip area will be scrubbed with a special solution to kill any germs.  The procedure:  Once you are asleep or numb, the surgeon will move the bones (realign the fracture) before any incisions are made. The goal is to get the bones back to their normal position.  X-rays may be taken. This is to check the position of the bones.  An incision is made over the hip. It will go through the muscles to the broken bone.  The bones will be put in place. Some type of hardware will be used to hold the bone together.  The hip is a ball-and-socket joint. The "ball" part of the joint is the very top of the upper leg bone (femur). Sometimes the very top of the upper leg bone is replaced with a man-made piece. If it is replaced, this is called a partial hip replacement. Sometimes a complete or total hip replacement will be preformed, replacing both the ball and the socket. This is the preferred treatment if there is any appearance of arthritis in the hip joint.  The incision is closed with small stitches or staples.  A dressing (medicine and a bandage) is put over the incision.  An ORIF procedure can take several hours. AFTER THE PROCEDURE  You will stay in a recovery area until the anesthesia has worn off. Your blood pressure and pulse will be checked every so often. Then you will be taken to a hospital room.  You may continue to get fluids through the IV for awhile.  Some pain is normal after an ORIF procedure. You will probably be given pain medicine. Be sure to tell your caregivers if the pain becomes severe.  It is important to be up and moving as soon as possible after an operation. Physical therapists will help you start walking. You will probably need to use a walker for a while. Follow the therapists instructions regarding weight bearing on the injured leg.  To prevent blood clots in your legs:  You may be given special stockings to  wear.  You may need to take medicine to prevent clots.  Most people stay in the hospital for several days after this surgery.  Physical therapy is usually needed. Some people go to a rehabilitation center (a long-term care center or transitional care unit) before going home. Ask your healthcare providers what would be best for you. Often social workers are available to help you and your family make the best decision for you. HOME CARE INSTRUCTIONS  Medication.  Take any pain medicine that your surgeon suggests. Follow the directions carefully. Do not take over-the-counter painkillers unless the surgeon says it is OK. Medicine such as aspirin or ibuprofen can increase the chances of bleeding.  Your healthcare provider may prescribe a blood-thinner for several weeks to 2 months. These drugs prevent blood clots.  Wound care.  Check the area around the incision carefully each day. Look for any redness or swelling. Also check for any fluid that is seeping from the incision. Tell your healthcare provider if you see anything.  Do not get the incision wet until your surgeon says it is OK.  Activity.  Most people will need the help of a walker or crutches for some time.  You will need to continue physical therapy once you are home. This often lasts for several months.  You will learn how to avoid putting stress on your hip while it heals if that is the direction given by your surgeon.  Be sure to do any exercises the therapist suggests. These exercises will help make your hip stronger.  Special equipment might make life at home easier. One example is a seat for the shower. Another is a raised toilet seat.  Ask your healthcare provider when you can resume other activities, such as work, driving or sex.  Follow-up care.  The surgeon may need to take out stitches or staples. This is usually done about two weeks after the operation.  The surgeon will do X-rays to check how your hip is  healing. SEEK MEDICAL CARE IF:   You have any questions about medications.  You feel weak.  You are too tired to walk every day.  Pain continues, even after taking pain medicine.  You develop a fever of more than 100.5 F (38.1 C). SEEK IMMEDIATE MEDICAL CARE IF:   The incision becomes red or swollen. Or, it bleeds.  Your leg or foot becomes painful and swollen.  Your leg becomes pale or blue. It feels cold. It tingles or is numb.  You have trouble breathing.  You have chest pain.  You develop a fever of more than 102 F (38.9 C). Document Released: 06/11/2009 Document Revised: 09/15/2011 Document Reviewed: 06/11/2009 [facility] Patient Information 2014 [place], [place].   Cast or Splint Care Casts and splints support injured limbs and keep bones from moving while they heal.  HOME CARE  Keep the cast or splint uncovered during the drying period.  A plaster cast can take 24 to 48 hours to dry.  A fiberglass cast will dry in less than 1 hour.  Do not rest the cast on anything harder than a pillow for 24 hours.  Do not put weight on your injured limb. Do not put pressure on the cast. Wait for your doctor's approval.  Keep the cast or splint dry.  Cover the cast or splint with a plastic bag during baths or wet weather.  If you have a cast over your chest and belly (trunk), take sponge baths until the cast is taken off.  If your cast gets wet, dry it with a towel or blow dryer. Use the cool setting on the blow dryer.  Keep your cast or splint clean. Wash a dirty cast with a damp cloth.  Do not put any objects under your cast or splint.  Do not scratch the skin under the cast with an object. If itching is a problem, use a blow dryer on a cool setting over the itchy area.  Do not trim or cut your cast.  Do not take out the padding from inside your cast.  Exercise your joints near the cast as told by your doctor.  Raise (elevate) your injured limb on 1 or 2  pillows for the first 1 to 3 days. GET HELP IF:  Your cast or splint cracks.  Your cast or splint is too tight or too loose.  You itch badly under the cast.  Your cast gets wet or has a soft spot.  You have a bad smell coming from the cast.  You get an object stuck under the cast.  Your skin around the cast becomes red or sore.  You have new or more pain after the cast is put on. GET HELP RIGHT AWAY IF:  You have fluid leaking through the cast.  You cannot move your fingers or toes.  Your fingers or toes turn blue or white or are cool, painful, or puffy (swollen).  You have tingling or lose feeling (numbness) around the injured area.  You have bad pain or pressure under the cast.  You have trouble breathing or have shortness of breath.  You have chest pain. Document Released: 10/23/2010 Document Revised: 02/23/2013 Document Reviewed: 12/30/2012 [facility] Patient Information 2014 [place].

## 2013-09-19 NOTE — Progress Notes (Signed)
Physical Therapy Treatment Patient Details Name: Joanne Vaughn MRN: [id number] DOB: 1951/02/27 Today's Date: 09/19/2013 Time: 4235-3614 PT Time Calculation (min): 25 min  PT Assessment / Plan / Recommendation  History of Present Illness s/p IM nail hip and  Left distal radius fx   PT Comments   Assisted out of recliner to amb and practice one step using L platform RW.  Performed one step twice.  First time went up forward however L knee buckled.  Second time went up backward and pt did better.  Instructed on proper walker placement and sequencing.  Decreased amb distance this session due to increased c/o hip pain and mild c/o nausea.  Returned to room then assisted back to bed per pt request followed by ICE.  Pt is hopeful she will D/C to home today.  "I'm ready", she stated.  Follow Up Recommendations  Home health PT     Does the patient have the potential to tolerate intense rehabilitation     Barriers to Discharge        Equipment Recommendations   (L platform)    Recommendations for Other Services    Frequency Min 6X/week   Progress towards PT Goals Progress towards PT goals: Progressing toward goals  Plan      Precautions / Restrictions Precautions Precautions: Fall Precaution Comments: NWB L UE Restrictions Weight Bearing Restrictions: No LLE Weight Bearing: Weight bearing as tolerated   Pertinent Vitals/Pain C/o 6/10 hip pain Slight wrist "discomfort'   Mobility  Bed Mobility Overal bed mobility: Needs Assistance Bed Mobility: Sit to Supine Sit to supine: Min guard General bed mobility comments: cues for technique, incr time Transfers Overall transfer level: Needs assistance Equipment used: Left platform walker Transfers: Sit to/from Stand Sit to Stand: Min guard General transfer comment: cues for hand placement and wt shift plus increased time  Ambulation/Gait Ambulation/Gait assistance: Min guard Ambulation Distance (Feet): 75 Feet Assistive device: Left  platform walker Gait Pattern/deviations: Step-to pattern;Antalgic Gait velocity: decreased General Gait Details: cues for sequence and wt shift to UEs .  Decrease amb distance this session due to increased c/o pain along with mild nausea Stairs: Yes Stairs assistance: Min assist Stair Management: No rails;Backwards;Forwards;With walker Number of Stairs: 1 General stair comments: 50% VC's on proper tech performed twice.  First time went up forward but L knee buckled requiring extra assist.  Second time went up backward which pt did better.  required Min Assist with heavy platform walker placement as pt is unable to use L UE.     Exercises  B LE AP B LE knee presses    PT Goals (current goals can now be found in the care plan section) Acute Rehab PT Goals Patient Stated Goal: hoe today  Visit Information  Last PT Received On: 09/19/13 Assistance Needed: +1 History of Present Illness: s/p IM nail hip and  Left distal radius fx    Subjective Data  Patient Stated Goal: hoe today   Cognition  Cognition Arousal/Alertness: Awake/alert Behavior During Therapy: WFL for tasks assessed/performed Overall Cognitive Status: Within Functional Limits for tasks assessed    Balance     End of Session PT - End of Session Equipment Utilized During Treatment: Gait belt Activity Tolerance: Patient limited by fatigue Patient left: in bed;with call bell/phone within reach   Rica Koyanagi  PTA [facility]  Acute  Rehab Pager      [phone]

## 2013-09-19 NOTE — Discharge Summary (Signed)
Joanne Vaughn, is a 63 y.o. female  DOB 1951/02/28  MRN [id number].  Admission date:  09/16/2013  Admitting Physician  Oswald Hillock, MD  Discharge Date:  09/19/2013   Primary MD  Lucretia Kern., DO  Recommendations for primary care physician for things to follow:   Follow clinically, consider outpt Cardiology follow up   Admission Diagnosis  Fracture, intertrochanteric, left femur [820.21] Left radial fracture [813.81] Fall due to stumbling [E885.9]   Discharge Diagnosis  Fracture, intertrochanteric, left femur [820.21] Left radial fracture [813.81] Fall due to stumbling [E885.9]     Principal Problem:   Hip fracture Active Problems:   Osteoarthritis of knees and back      Past Medical History  Diagnosis Date  . Arthritis   . Chicken pox   . Heart murmur     since childhood, benign  . Diverticulosis     Past Surgical History  Procedure Laterality Date  . Abdominal hysterectomy    . Ovaries removed  1994  . Breast biopsy  1973    benign     Discharge Condition: stable   Follow UP  Follow-up Information   Follow up with [name], MD. Schedule an appointment as soon as possible for a visit on 09/28/2013. (Follow up next Wednesday March 25th.  Call for appointment time.)    Specialty:  Orthopedic Surgery   Contact information:   [address] [place] [address] [phone]       Follow up with Colin Benton R., DO. Schedule an appointment as soon as possible for a visit in 1 week.   Specialty:  Family Medicine   Contact information:   [place] [address] [phone]       Follow up with [place])    Contact information:   [place] [address] [phone]          Discharge Instructions  and  Discharge Medications      Discharge Orders   Future Orders Complete By Expires   Call MD / Call 911  As directed    Comments:     If you experience chest pain or shortness of breath, CALL 911 and be transported to the hospital emergency room.  If you develope a fever above 101 F, pus (white drainage) or increased drainage or redness at the wound, or calf pain, call your surgeon's office.   Change dressing  As directed    Comments:     You may change your dressing dressing daily with sterile 4 x 4 inch gauze dressing and paper tape.  Do not submerge the incision under water.   Constipation Prevention  As directed    Comments:     Drink plenty of fluids.  Prune juice may be helpful.  You may use a stool softener, such as Colace (over the counter) 100 mg twice a day.  Use MiraLax (over the counter) for constipation as needed.  Diet - low sodium heart healthy  As directed    Diet general  As directed    Discharge instructions  As directed    Comments:     Pick up stool softner and laxative for home. Do not submerge hip incision under water. May shower but must keep arm splint DRY Continue to use ice for pain and swelling from surgery. Non weight bearing to left arm Keep splint clean and dry. Continue elevation of left arm for pain and swelling. Lovenox for eight more days and then switch over to a full dose 325 mg Aspirin daily for four more weeks.   Discharge instructions  As directed    Comments:     Follow with Primary MD Colin Benton R., DO in 7 days   Get CBC, CMP, checked 7 days by Primary MD and again as instructed by your Primary MD. Get a 2 view Chest X ray done next visit if you had Pneumonia of Lung problems at the Hospital.   Activity: As tolerated with Full fall precautions use walker/cane & assistance as needed, keep left arm in splint - do not use L arm till told to do so by Dr Ricki Rodriguez   Disposition Home    Diet: Heart Healthy     For Heart failure patients - Check your Weight same time everyday, if you gain over 2 pounds, or you develop in leg swelling, experience more shortness of breath or chest pain, call your Primary MD immediately. Follow Cardiac Low Salt Diet and 1.8 lit/day fluid restriction.   On your next visit with her primary care physician please Get Medicines reviewed and adjusted.  Please request your Prim.MD to go over all Hospital Tests and Procedure/Radiological results at the follow up, please get all Hospital records sent to your Prim MD by signing hospital release before you go home.   If you experience worsening of your admission symptoms, develop shortness of breath, life threatening emergency, suicidal or homicidal thoughts you must seek medical attention immediately by calling 911 or calling your MD immediately  if symptoms less severe.  You Must read complete instructions/literature along with all the possible adverse reactions/side effects for all the Medicines you take and that have been prescribed to you. Take any new Medicines after you have completely understood and accpet all the possible adverse reactions/side effects.   Do not drive and provide baby sitting services if your were admitted for syncope or siezures until you have seen by Primary MD or a Neurologist and advised to do so again.  Do not drive when taking Pain medications.    Do not take more than prescribed Pain, Sleep and Anxiety Medications  Special Instructions: If you have smoked or chewed Tobacco  in the last 2 yrs please stop smoking, stop any regular Alcohol  and or any Recreational drug use.  Wear Seat belts while driving.   Please note  You were cared for by a hospitalist during your hospital stay. If you have any questions about your discharge medications or the care you received while you were in the hospital after you are discharged, you can call the unit and asked to speak with the hospitalist on call if  the hospitalist that took care of you is not available. Once you are discharged, your primary care physician will handle any further medical issues. Please note that NO REFILLS for any discharge medications will be authorized once you are discharged, as it is imperative that you return to your  primary care physician (or establish a relationship with a primary care physician if you do not have one) for your aftercare needs so that they can reassess your need for medications and monitor your lab values.   Discharge patient  As directed    Do not sit on low chairs, stoools or toilet seats, as it may be difficult to get up from low surfaces  As directed    Driving restrictions  As directed    Comments:     No driving until released by the physician.   Follow the hip precautions as taught in Physical Therapy  As directed    Increase activity slowly as tolerated  As directed    Increase activity slowly  As directed    Lifting restrictions  As directed    Comments:     No lifting until released by the physician.   Non weight bearing  As directed    Questions:     Laterality:  left   Extremity:  Upper   TED hose  As directed    Comments:     Use stockings (TED hose) for 3 weeks on both leg(s).  You may remove them at night for sleeping.   Weight bearing as tolerated  As directed    Questions:     Laterality:  left   Extremity:  Lower       Medication List    STOP taking these medications       traMADol 50 MG tablet  Commonly known as:  ULTRAM      TAKE these medications       acetaminophen 500 MG tablet  Commonly known as:  TYLENOL  Take 1,000 mg by mouth every 6 (six) hours as needed for headache.     aspirin EC 325 MG tablet  Take 1 tablet (325 mg total) by mouth daily.  Start taking on:  09/26/2013     DSS 100 MG Caps  Take 100 mg by mouth 2 (two) times daily.     enoxaparin 40 MG/0.4ML injection  Commonly known as:  LOVENOX  Inject 0.4 mLs (40 mg total) into the skin  daily. Lovenox injections for eight more days and then switch over to a full dose 325 mg Aspirin daily for four more weeks.     methocarbamol 500 MG tablet  Commonly known as:  ROBAXIN  Take 1 tablet (500 mg total) by mouth every 6 (six) hours as needed for muscle spasms.     oxyCODONE 5 MG immediate release tablet  Commonly known as:  Oxy IR/ROXICODONE  Take 1-2 tablets (5-10 mg total) by mouth every 6 (six) hours as needed for moderate pain, severe pain or breakthrough pain ((for MODERATE breakthrough pain)).          Diet and Activity recommendation: See Discharge Instructions above   Consults obtained - Ortho   Major procedures and Radiology Reports - PLEASE review detailed and final reports for all details, in brief -      Dg Chest 1 View  09/17/2013   CLINICAL DATA:  Fall  EXAM: CHEST - 1 VIEW  COMPARISON:  None.  FINDINGS: The heart size and mediastinal contours are within normal limits. Both lungs are clear. The visualized skeletal structures are unremarkable.  IMPRESSION: No active disease.   Electronically Signed   By: Franchot Gallo M.D.   On: 09/17/2013 00:38   Dg Wrist Complete Left  09/17/2013   CLINICAL DATA:  Left wrist pain following a  fall tonight.  EXAM: LEFT WRIST - COMPLETE 3+ VIEW  COMPARISON:  None.  FINDINGS: Comminuted and mildly impacted transverse fracture of the distal radial metaphysis with probable extension into the radiocarpal joint. Mild dorsal angulation of the distal fragment without significant displacement.  IMPRESSION: Distal radius fracture, as described above.   Electronically Signed   By: Enrique Sack M.D.   On: 09/17/2013 00:31   Dg Hip Complete Left  09/17/2013   CLINICAL DATA:  Left hip pain following a fall tonight.  EXAM: LEFT HIP - COMPLETE 2+ VIEW  COMPARISON:  None.  FINDINGS: Left intertrochanteric fracture without significant displacement or angulation.  IMPRESSION: Essentially nondisplaced left intertrochanteric fracture.    Electronically Signed   By: Enrique Sack M.D.   On: 09/17/2013 00:29   Dg Femur Left  09/17/2013   CLINICAL DATA:  Intramedullary rod placement.  EXAM: LEFT FEMUR - 2 VIEW  COMPARISON:  DG HIP COMPLETE*L* dated 09/17/2013  FINDINGS: Three intraoperative views. Placement of a dynamic screw across the previously described intertrochanteric femur fracture. No acute hardware complication.  IMPRESSION: Internal fixation of intertrochanteric femur fracture, without acute complication.   Electronically Signed   By: Abigail Miyamoto M.D.   On: 09/17/2013 13:40   Dg C-arm 1-60 Min-no Report  09/17/2013   CLINICAL DATA: IM nail left hip   C-ARM 1-60 MINUTES  Fluoroscopy was utilized by the requesting physician.  No radiographic  interpretation.     Micro Results      Recent Results (from the past 240 hour(s))  MRSA PCR SCREENING     Status: None   Collection Time    09/17/13  9:01 AM      Result Value Ref Range Status   MRSA by PCR NEGATIVE  NEGATIVE Final   Comment:            The GeneXpert MRSA Assay (FDA     approved for NASAL specimens     only), is one component of a     comprehensive MRSA colonization     surveillance program. It is not     intended to diagnose MRSA     infection nor to guide or     monitor treatment for     MRSA infections.     History of present illness and  Hospital Course:     Kindly see H&P for history of present illness and admission details, please review complete Labs, Consult reports and Test reports for all details in brief Shayma Schwiesow, is a 63 y.o. female, patient with history of heart murmur, was admitted to the Hospital after an Trip and Fall causing left distal radius and left femoral intertrochanteric fracture .     1. Trip and fall causing left distal radius and left femoral intertrochanteric fracture - status post open reduction internal fixation by Dr. Ricki Rodriguez on 09/17/2013, she has tolerated the procedure well, weight bearing as tolerated, Lovenox  for 1 week then ASA 325 per Ortho, will get HHPT. Left distal radial fracture to be treated conservatively arm in splint. Outpatient monitoring by orthopedics post discharge.      2. Chronic murmur. No acute issues likely mitral valve prolapse outpatient followup with PCP and cardiology if needed.       3. Low potassium on admission. Replaced & stable        Today   Subjective:   Giannamarie Ciprian today has no headache, no chest abdominal pain,no new weakness tingling or numbness, feels much better wants  to go home today.    Objective:   Blood pressure 112/67, pulse 82, temperature 99.1 F (37.3 C), temperature source Oral, resp. rate 16, height 5\' 3"  (1.6 m), weight 65.318 kg (144 lb), SpO2 93.00%.   Intake/Output Summary (Last 24 hours) at 09/19/13 1126 Last data filed at 09/19/13 0920  Gross per 24 hour  Intake    240 ml  Output    850 ml  Net   -610 ml    Exam Awake Alert, Oriented *3, No new F.N deficits, Normal affect [place].AT,PERRAL Supple Neck,No JVD, No cervical lymphadenopathy appriciated.  Symmetrical Chest wall movement, Good air movement bilaterally, CTAB RRR,No Gallops,Rubs or new Murmurs, No Parasternal Heave +ve B.Sounds, Abd Soft, Non tender, No organomegaly appriciated, No rebound -guarding or rigidity. No Cyanosis, Clubbing or edema, No new Rash or bruise  Data Review   CBC w Diff: Lab Results  Component Value Date   WBC 9.3 09/19/2013   HGB 10.5* 09/19/2013   HCT 32.2* 09/19/2013   PLT 159 09/19/2013   LYMPHOPCT 10* 09/17/2013   MONOPCT 6 09/17/2013   EOSPCT 1 09/17/2013   BASOPCT 0 09/17/2013    CMP: Lab Results  Component Value Date   NA 137 09/19/2013   K 4.7 09/19/2013   CL 100 09/19/2013   CO2 26 09/19/2013   BUN 10 09/19/2013   CREATININE 0.67 09/19/2013  .   Total Time in preparing paper work, data evaluation and todays exam - 35 minutes  Thurnell Lose M.D on 09/19/2013 at 11:26 AM  [facility] Office   [phone]

## 2013-09-19 NOTE — Evaluation (Signed)
Occupational Therapy Evaluation Patient Details Name: Aleysha Meckler MRN: [id number] DOB: 11-Oct-1950 Today's Date: 09/19/2013 Time: 1884-1660 OT Time Calculation (min): 22 min  OT Assessment / Plan / Recommendation History of present illness s/p IM nail hip and  Left distal radius fx   Clinical Impression  Pt presents to OT with decreased I with ADL activity s/p fall. All education complete regarding ADL activity post DC. Pt will have 24/7 A .    OT Assessment  Patient does not need any further OT services    Follow Up Recommendations  No OT follow up       Equipment Recommendations  None recommended by OT          Precautions / Restrictions Precautions Precaution Comments: NWB L UE Restrictions Weight Bearing Restrictions: No LLE Weight Bearing: Weight bearing as tolerated       ADL  Grooming: Minimal assistance Where Assessed - Grooming: Unsupported sitting Upper Body Bathing: Minimal assistance Where Assessed - Upper Body Bathing: Unsupported sitting Lower Body Bathing: Minimal assistance Where Assessed - Lower Body Bathing: Unsupported sit to stand Upper Body Dressing: Minimal assistance Where Assessed - Upper Body Dressing: Unsupported sitting Lower Body Dressing: Minimal assistance Where Assessed - Lower Body Dressing: Unsupported sit to stand Toilet Transfer: Min guard Toilet Transfer Method: Sit to Loss adjuster, chartered: Comfort height toilet Toileting - Clothing Manipulation and Hygiene: Minimal assistance Where Assessed - Camera operator Manipulation and Hygiene: Standing Transfers/Ambulation Related to ADLs: daughter in law will A with ADL activity . Pt will obtain a reacher to increase I with ADL activity. Educated pt on walker safety with ADL activity      OT Goals(Current goals can be found in the care plan section) Acute Rehab OT Goals Patient Stated Goal: hoe today OT Goal Formulation: With patient  Visit Information  Assistance  Needed: +1 History of Present Illness: s/p IM nail hip and  Left distal radius fx       Prior [place] expects to be discharged to:: Private residence Living Arrangements: Spouse/significant other Type of Home: House Home Access: Stairs to enter [employer] of Steps: 1 Home Layout: Two level;Able to live on main level with bedroom/bathroom Alternate Level Stairs-Number of Steps: pt's bedroom is upstairs but she can stay in downstairs bedroom Home Equipment: Gilford Rile - 2 wheels Additional Comments: dtr-in-law coming to stay 24/7; will arrive in few days from [place]  Prior Function Level of Independence: Independent Communication Communication: No difficulties         Vision/Perception Vision - History Patient Visual Report: No change from baseline   Cognition  Cognition Arousal/Alertness: Awake/alert Behavior During Therapy: WFL for tasks assessed/performed Overall Cognitive Status: Within Functional Limits for tasks assessed    Extremity/Trunk Assessment Upper Extremity Assessment LUE Deficits / Details: L shoulder WFL. elbow and wrist in cast. no edema noted in fingers     Mobility Bed Mobility Overal bed mobility: Needs Assistance Bed Mobility: Sit to Supine Sit to supine: Min guard General bed mobility comments: cues for technique, incr time Transfers Overall transfer level: Needs assistance Equipment used: Left platform walker Sit to Stand: Min guard General transfer comment: cues for hand placement and wt shift            End of Session OT - End of Session Activity Tolerance: Patient tolerated treatment well Patient left: in chair;with call bell/phone within reach Nurse Communication: Mobility status  GO     Yanisa Goodgame, Edwena Felty  D 09/19/2013, 9:06 AM

## 2014-03-09 ENCOUNTER — Ambulatory Visit (INDEPENDENT_AMBULATORY_CARE_PROVIDER_SITE_OTHER): Admitting: Family Medicine

## 2014-03-09 ENCOUNTER — Encounter: Payer: Self-pay | Admitting: Family Medicine

## 2014-03-09 VITALS — BP 102/70 | HR 70 | Temp 97.9°F | Ht 63.0 in | Wt 137.0 lb

## 2014-03-09 DIAGNOSIS — E785 Hyperlipidemia, unspecified: Secondary | ICD-10-CM

## 2014-03-09 DIAGNOSIS — F32A Depression, unspecified: Secondary | ICD-10-CM

## 2014-03-09 DIAGNOSIS — F3289 Other specified depressive episodes: Secondary | ICD-10-CM

## 2014-03-09 DIAGNOSIS — M25559 Pain in unspecified hip: Secondary | ICD-10-CM

## 2014-03-09 DIAGNOSIS — M25552 Pain in left hip: Secondary | ICD-10-CM

## 2014-03-09 DIAGNOSIS — F329 Major depressive disorder, single episode, unspecified: Secondary | ICD-10-CM

## 2014-03-09 LAB — BASIC METABOLIC PANEL
BUN: 12 mg/dL (ref 6–23)
CHLORIDE: 103 meq/L (ref 96–112)
CO2: 30 meq/L (ref 19–32)
Calcium: 8.9 mg/dL (ref 8.4–10.5)
Creatinine, Ser: 0.7 mg/dL (ref 0.4–1.2)
GFR: 96.54 mL/min (ref >60.00)
Glucose, Bld: 69 mg/dL — ABNORMAL LOW (ref 70–99)
Potassium: 4 mEq/L (ref 3.5–5.1)
SODIUM: 138 meq/L (ref 135–145)

## 2014-03-09 LAB — CBC WITH DIFFERENTIAL/PLATELET
Basophils Absolute: 0.1 10*3/uL (ref 0.0–0.1)
Basophils Relative: 1.7 % (ref 0.0–3.0)
EOS PCT: 3 % (ref 0.0–5.0)
Eosinophils Absolute: 0.2 10*3/uL (ref 0.0–0.7)
HEMATOCRIT: 37.6 % (ref 36.0–46.0)
Hemoglobin: 12.7 g/dL (ref 12.0–15.0)
LYMPHS ABS: 1.8 10*3/uL (ref 0.7–4.0)
Lymphocytes Relative: 29.9 % (ref 12.0–46.0)
MCHC: 33.8 g/dL (ref 30.0–36.0)
MCV: 90.6 fl (ref 78.0–100.0)
MONOS PCT: 9.6 % (ref 3.0–12.0)
Monocytes Absolute: 0.6 10*3/uL (ref 0.1–1.0)
NEUTROS PCT: 55.8 % (ref 43.0–77.0)
Neutro Abs: 3.4 10*3/uL (ref 1.4–7.7)
PLATELETS: 207 10*3/uL (ref 150.0–400.0)
RBC: 4.15 Mil/uL (ref 3.87–5.11)
RDW: 13 % (ref 11.5–15.5)
WBC: 6.1 10*3/uL (ref 4.0–10.5)

## 2014-03-09 LAB — TSH: TSH: 9.3 u[IU]/mL — ABNORMAL HIGH (ref 0.35–4.50)

## 2014-03-09 LAB — VITAMIN D 25 HYDROXY (VIT D DEFICIENCY, FRACTURES): VITD: 39.94 ng/mL (ref 30.00–100.00)

## 2014-03-09 NOTE — Progress Notes (Signed)
No chief complaint on file.   HPI:  Acute visit for:  1)Hip pain: -fell and broke hip and ankle about 6 months ago - tripped over dog, this was traumatic for her -energy level has been down since, poor sleep due to pain in the hip since, taking tramadol a few times per week for this pain -pain is limiting her activities, she is very active usually and this has been so stressful for her -last physical 02/2014 -denies: fevers, SOB, DOE, melena, hematochezia, weight loss  2)Depression: Depression Symptoms: -reports this whole ordeal has really made her depressed, feels like on the side lines due to limitations with pain Sleep disorder: yes poor slep Interest deficit/anhedonia: yes Guilt (worthlessness, hopelessness, regret): overwhelmed Energy deficit: yes Concentration deficit: yes Appetite disorder: yes Psychomotor retardation or agitation: yes Suicidality: no  Depressed mood or Anhedonia plus 4/8 symptoms of depression?   ROS: See pertinent positives and negatives per HPI.  Past Medical History  Diagnosis Date  . Arthritis   . Chicken pox   . Heart murmur     since childhood, benign  . Diverticulosis     Past Surgical History  Procedure Laterality Date  . Abdominal hysterectomy    . Ovaries removed  1994  . Breast biopsy  1973    benign  . Intramedullary (im) nail intertrochanteric Left 09/17/2013    Procedure: INTRAMEDULLARY (IM) NAIL INTERTROCHANTRIC;  Surgeon: [name], MD;  Location: [facility];  Service: Orthopedics;  Laterality: Left;    Family History  Problem Relation Age of Onset  . Arthritis      parent/grandparent  . Heart disease      parent/grandparent  . Diabetes Other   . Heart disease Mother 51    MI  . COPD Father   . Osteoarthritis Father   . Diabetes Sister   . Asthma Sister     History   Social History  . Marital Status: Married    Spouse Name: N/A    Number of Children: N/A  . Years of Education: N/A   Social History Main  Topics  . Smoking status: Never Smoker   . Smokeless tobacco: None  . Alcohol Use: No  . Drug Use: None  . Sexual Activity: None   Other Topics Concern  . None   Social History Narrative   Work or School: homemaker, [employer] in [place]      Home Situation: lives with husband      Spiritual Beliefs: Mina Marble, Darrick Meigs      Lifestyle: elliptical 20 minutes a few times per week, very active - gardening, planting, healthy diet                Current outpatient prescriptions:CALCIUM PO, Take by mouth daily., Disp: , Rfl: ;  Cholecalciferol (VITAMIN D3 PO), Take by mouth daily., Disp: , Rfl: ;  COCONUT OIL PO, Take by mouth., Disp: , Rfl: ;  Cyanocobalamin (VITAMIN B-12 PO), Take by mouth daily., Disp: , Rfl: ;  Glucosamine-Chondroitin (GLUCOSAMINE CHONDR COMPLEX PO), Take by mouth daily., Disp: , Rfl:  traMADol (ULTRAM) 50 MG tablet, Take by mouth every 6 (six) hours as needed., Disp: , Rfl:   EXAM:  Filed Vitals:   03/09/14 1351  BP: 102/70  Pulse: 70  Temp: 97.9 F (36.6 C)    Body mass index is 24.27 kg/(m^2).  GENERAL: vitals reviewed and listed above, alert, oriented, appears well hydrated and in no acute distress  HEENT: atraumatic, conjunttiva clear,  no obvious abnormalities on inspection of external nose and ears  NECK: no obvious masses on inspection  LUNGS: clear to auscultation bilaterally, no wheezes, rales or rhonchi, good air movement  CV: HRRR, no peripheral edema  MS: moves all extremities without noticeable abnormality L hip TTP, gait normal, well healed surgical scars  PSYCH: pleasant and cooperative, no obvious depression or anxiety  ASSESSMENT AND PLAN:  Discussed the following assessment and plan:  Depression - Plan: CBC with Differential, TSH, Basic metabolic panel, Vitamin D, 25-hydroxy -I think this is likely the cause of her fatigue -advised medication SSRI or SNRI, cousneling, close follow up, exercise, healthy  diet -she has good support -she wants to hold on medicine for now, close follow up  Hip pain, left - Plan: Vitamin D, 25-hydroxy -advised she needs to follow up with ortho as pai is limiting activity  Other and unspecified hyperlipidemia -check at follow up, not fasting today  -Patient advised to return or notify a doctor immediately if symptoms worsen or persist or new concerns arise.  Patient Instructions  -Please schedule an appointment with your orthopedic docotor about your pain  -please consider getting counseling (cognitive behavioral therapy)  -consider medication for the depression and pain - follow up with me in 1 month     Shyhiem Beeney R.

## 2014-03-09 NOTE — Patient Instructions (Signed)
-  Please schedule an appointment with your orthopedic docotor about your pain  -please consider getting counseling (cognitive behavioral therapy)  -consider medication for the depression and pain - follow up with me in 1 month

## 2014-03-09 NOTE — Progress Notes (Signed)
Pre visit review using our clinic review tool, if applicable. No additional management support is needed unless otherwise documented below in the visit note. 

## 2014-04-10 ENCOUNTER — Encounter: Payer: Self-pay | Admitting: Family Medicine

## 2014-04-10 ENCOUNTER — Ambulatory Visit (INDEPENDENT_AMBULATORY_CARE_PROVIDER_SITE_OTHER): Admitting: Family Medicine

## 2014-04-10 VITALS — BP 100/64 | HR 76 | Temp 98.2°F | Ht 63.0 in | Wt 137.0 lb

## 2014-04-10 DIAGNOSIS — E785 Hyperlipidemia, unspecified: Secondary | ICD-10-CM

## 2014-04-10 DIAGNOSIS — R946 Abnormal results of thyroid function studies: Secondary | ICD-10-CM

## 2014-04-10 DIAGNOSIS — M25552 Pain in left hip: Secondary | ICD-10-CM

## 2014-04-10 LAB — LIPID PANEL
Cholesterol: 254 mg/dL — ABNORMAL HIGH (ref 0–200)
HDL: 48.1 mg/dL (ref >39.00)
LDL Cholesterol: 179 mg/dL — ABNORMAL HIGH (ref 0–99)
NonHDL: 205.9
TRIGLYCERIDES: 137 mg/dL (ref 0.0–149.0)
Total CHOL/HDL Ratio: 5
VLDL: 27.4 mg/dL (ref 0.0–40.0)

## 2014-04-10 LAB — T4, FREE: FREE T4: 0.64 ng/dL (ref 0.60–1.60)

## 2014-04-10 LAB — TSH: TSH: 8.45 u[IU]/mL — ABNORMAL HIGH (ref 0.35–4.50)

## 2014-04-10 NOTE — Addendum Note (Signed)
Addended by: Agnes Lawrence on: 04/10/2014 10:43 AM   Modules accepted: Orders

## 2014-04-10 NOTE — Progress Notes (Signed)
Pre visit review using our clinic review tool, if applicable. No additional management support is needed unless otherwise documented below in the visit note. 

## 2014-04-10 NOTE — Progress Notes (Signed)
No chief complaint on file.   HPI:  1)L Hip pain:  -fell and broke hip and ankle about in early 2015 -exercising and is doing better -advised she see her orthopedic doctor at last visit -denies: fevers, SOB, DOE, melena, hematochezia, weight loss   2)Depression:  -advised counseling and medication last visit -TSH mildly high - advised recheck today -reports she actually had a low thyroid level in  [place] in the past and took a medication for this -she reports: taking OTC supplement with iodine and fees better Depression Symptoms:  -reports this whole ordeal has really made her depressed, feels like on the side lines due to limitations with pain  Sleep disorder: improved Interest deficit/anhedonia: improved Guilt (worthlessness, hopelessness, regret): no Energy deficit: improved Concentration deficit: improved Appetite disorder: improved Psychomotor retardation or agitation: improved  Suicidality: no   Past Medical History  Diagnosis Date  . Arthritis   . Chicken pox   . Heart murmur     since childhood, benign  . Diverticulosis     Past Surgical History  Procedure Laterality Date  . Abdominal hysterectomy    . Ovaries removed  1994  . Breast biopsy  1973    benign  . Intramedullary (im) nail intertrochanteric Left 09/17/2013    Procedure: INTRAMEDULLARY (IM) NAIL INTERTROCHANTRIC;  Surgeon: [name], MD;  Location: [facility];  Service: Orthopedics;  Laterality: Left;    Family History  Problem Relation Age of Onset  . Arthritis      parent/grandparent  . Heart disease      parent/grandparent  . Diabetes Other   . Heart disease Mother 12    MI  . COPD Father   . Osteoarthritis Father   . Diabetes Sister   . Asthma Sister     History   Social History  . Marital Status: Married    Spouse Name: N/A    Number of Children: N/A  . Years of Education: N/A   Social History Main Topics  . Smoking status: Never Smoker   . Smokeless tobacco: None  .  Alcohol Use: No  . Drug Use: None  . Sexual Activity: None   Other Topics Concern  . None   Social History Narrative   Work or School: homemaker, [employer] in [place]      Home Situation: lives with husband      Spiritual Beliefs: Mina Marble, Darrick Meigs      Lifestyle: elliptical 20 minutes a few times per week, very active - gardening, planting, healthy diet                Current outpatient prescriptions:CALCIUM PO, Take by mouth daily., Disp: , Rfl: ;  Cholecalciferol (VITAMIN D3 PO), Take by mouth daily., Disp: , Rfl: ;  COCONUT OIL PO, Take by mouth., Disp: , Rfl: ;  Cyanocobalamin (VITAMIN B-12 PO), Take by mouth daily., Disp: , Rfl: ;  Glucosamine-Chondroitin (GLUCOSAMINE CHONDR COMPLEX PO), Take by mouth daily., Disp: , Rfl: ;  NON FORMULARY, daily. T-balance plus (for thyroid function), Disp: , Rfl:  traMADol (ULTRAM) 50 MG tablet, Take by mouth every 6 (six) hours as needed., Disp: , Rfl:   EXAM:  Filed Vitals:   04/10/14 1000  BP: 100/64  Pulse: 76  Temp: 98.2 F (36.8 C)    Body mass index is 24.27 kg/(m^2).  GENERAL: vitals reviewed and listed above, alert, oriented, appears well hydrated and in no acute distress  HEENT: atraumatic, conjunttiva clear, no obvious abnormalities  on inspection of external nose and ears  NECK: no obvious masses on inspection  LUNGS: clear to auscultation bilaterally, no wheezes, rales or rhonchi, good air movement  CV: HRRR, no peripheral edema  MS: moves all extremities without noticeable abnormality  PSYCH: pleasant and cooperative, no obvious depression or anxiety  ASSESSMENT AND PLAN:  Discussed the following assessment and plan:  Abnormal thyroid function test - Plan: TSH, T4, Free  Hip pain, left  -mood and pain improving -discussed risks with supplements -recheck thyroid labs, discussed options if hypothyroid -Patient advised to return or notify a doctor immediately if symptoms worsen or  persist or new concerns arise.  There are no Patient Instructions on file for this visit.   Colin Benton R.

## 2014-04-11 MED ORDER — PRAVASTATIN SODIUM 40 MG PO TABS: 40.0000 mg | ORAL_TABLET | Freq: Every day | ORAL | Status: DC

## 2014-04-11 MED ORDER — LEVOTHYROXINE SODIUM 25 MCG PO TABS: 25.0000 ug | ORAL_TABLET | Freq: Every day | ORAL | Status: DC

## 2014-04-11 NOTE — Addendum Note (Signed)
Addended by: Agnes Lawrence on: 04/11/2014 09:56 AM   Modules accepted: Orders

## 2014-08-07 ENCOUNTER — Ambulatory Visit (INDEPENDENT_AMBULATORY_CARE_PROVIDER_SITE_OTHER): Admitting: Family Medicine

## 2014-08-07 ENCOUNTER — Encounter: Payer: Self-pay | Admitting: Family Medicine

## 2014-08-07 VITALS — BP 110/70 | HR 64 | Temp 97.7°F | Ht 63.0 in | Wt 130.2 lb

## 2014-08-07 DIAGNOSIS — E039 Hypothyroidism, unspecified: Secondary | ICD-10-CM

## 2014-08-07 DIAGNOSIS — M79652 Pain in left thigh: Secondary | ICD-10-CM

## 2014-08-07 DIAGNOSIS — E785 Hyperlipidemia, unspecified: Secondary | ICD-10-CM

## 2014-08-07 MED ORDER — LEVOTHYROXINE SODIUM 25 MCG PO TABS: 25.0000 ug | ORAL_TABLET | Freq: Every day | ORAL | Status: DC

## 2014-08-07 NOTE — Progress Notes (Signed)
Pre visit review using our clinic review tool, if applicable. No additional management support is needed unless otherwise documented below in the visit note. 

## 2014-08-07 NOTE — Progress Notes (Signed)
HPI:  Hypothyroid: -mild - dx in [place] and on supplements prior to seeing me -started very low dose synthroid 04/2014 -reports: she is feeling better on the synthroid - energy is improved - occ fatigue from time to time but much better -denies: palpitations, constipation, swelling  Hx Depression: -did counseling -reports: doing well -denies: SI, depressed mood  Hyperlipidemia: Meds: pravastatin -reports: she could not take the medication - upset her stomach - only took a few doses  L inner thigh muscle pain with certain movements since hip and ankle surgery -occ cramps in this area - better with stretching  HM: flu vaccine offered  ROS: See pertinent positives and negatives per HPI.  Past Medical History  Diagnosis Date  . Arthritis   . Chicken pox   . Heart murmur     since childhood, benign  . Diverticulosis     Past Surgical History  Procedure Laterality Date  . Abdominal hysterectomy    . Ovaries removed  1994  . Breast biopsy  1973    benign  . Intramedullary (im) nail intertrochanteric Left 09/17/2013    Procedure: INTRAMEDULLARY (IM) NAIL INTERTROCHANTRIC;  Surgeon: [name], MD;  Location: [facility];  Service: Orthopedics;  Laterality: Left;    Family History  Problem Relation Age of Onset  . Arthritis      parent/grandparent  . Heart disease      parent/grandparent  . Diabetes Other   . Heart disease Mother 42    MI  . COPD Father   . Osteoarthritis Father   . Diabetes Sister   . Asthma Sister     History   Social History  . Marital Status: Married    Spouse Name: N/A    Number of Children: N/A  . Years of Education: N/A   Social History Main Topics  . Smoking status: Never Smoker   . Smokeless tobacco: None  . Alcohol Use: No  . Drug Use: None  . Sexual Activity: None   Other Topics Concern  . None   Social History Narrative   Work or School: homemaker, [employer] in [place]      Home Situation: lives  with husband      Spiritual Beliefs: Mina Marble, Darrick Meigs      Lifestyle: elliptical 20 minutes a few times per week, very active - gardening, planting, healthy diet                 Current outpatient prescriptions:  .  CALCIUM PO, Take by mouth daily., Disp: , Rfl:  .  Cholecalciferol (VITAMIN D3 PO), Take by mouth daily., Disp: , Rfl:  .  COCONUT OIL PO, Take by mouth., Disp: , Rfl:  .  Cyanocobalamin (VITAMIN B-12 PO), Take by mouth daily., Disp: , Rfl:  .  Glucosamine-Chondroitin (GLUCOSAMINE CHONDR COMPLEX PO), Take by mouth daily., Disp: , Rfl:  .  levothyroxine (SYNTHROID) 25 MCG tablet, Take 1 tablet (25 mcg total) by mouth daily before breakfast., Disp: 30 tablet, Rfl: 0 .  NON FORMULARY, daily. T-balance plus (for thyroid function), Disp: , Rfl:  .  traMADol (ULTRAM) 50 MG tablet, Take by mouth every 6 (six) hours as needed., Disp: , Rfl:   EXAM:  Filed Vitals:   08/07/14 1512  BP: 110/70  Pulse: 64  Temp: 97.7 F (36.5 C)    Body mass index is 23.07 kg/(m^2).  GENERAL: vitals reviewed and listed above, alert, oriented, appears well hydrated and in no acute distress  HEENT: atraumatic, conjunttiva clear, no obvious abnormalities on inspection of external nose and ears  NECK: no obvious masses on inspection  LUNGS: clear to auscultation bilaterally, no wheezes, rales or rhonchi, good air movement  CV: HRRR, no peripheral edema  MS: moves all extremities without noticeable abnormality  PSYCH: pleasant and cooperative, no obvious depression or anxiety  ASSESSMENT AND PLAN:  Discussed the following assessment and plan:  Hypothyroidism, unspecified hypothyroidism type - Plan: TSH  Hyperlipemia - Plan: Lipid Panel  Left thigh pain  -advised labs - she is leaving for [place] and wants to do when she returns and cont current dose of synthroid for now -discussed options for cholesterol - she is going to try cutting back on coconut oil and may consider slow  titration of a different statin though reluctant -stretching/HEP for leg muscle cramp and PT if persists, she is to call call -advised preventive visit in 3-4 months -Patient advised to return or notify a doctor immediately if symptoms worsen or persist or new concerns arise.  Patient Instructions  BEFORE YOU LEAVE: -schedule lab appointment for when you return from [place] -physical in 4 months  Exercises provided and call if leg issues persist  Enjoy your trip!     Colin Benton R.

## 2014-08-07 NOTE — Patient Instructions (Addendum)
BEFORE YOU LEAVE: -schedule lab appointment for when you return from [place] -physical in 4 months -groin strain exercises  Exercises provided and call if leg issues persist  Enjoy your trip!

## 2014-08-24 ENCOUNTER — Other Ambulatory Visit (INDEPENDENT_AMBULATORY_CARE_PROVIDER_SITE_OTHER)

## 2014-08-24 DIAGNOSIS — E785 Hyperlipidemia, unspecified: Secondary | ICD-10-CM

## 2014-08-24 DIAGNOSIS — E039 Hypothyroidism, unspecified: Secondary | ICD-10-CM

## 2014-08-24 LAB — LIPID PANEL
Cholesterol: 185 mg/dL (ref 0–200)
HDL: 36.5 mg/dL — AB (ref >39.00)
LDL Cholesterol: 111 mg/dL — ABNORMAL HIGH (ref 0–99)
NONHDL: 148.5
Total CHOL/HDL Ratio: 5
Triglycerides: 188 mg/dL — ABNORMAL HIGH (ref 0.0–149.0)
VLDL: 37.6 mg/dL (ref 0.0–40.0)

## 2014-08-24 LAB — TSH: TSH: 6.3 u[IU]/mL — ABNORMAL HIGH (ref 0.35–4.50)

## 2014-08-25 ENCOUNTER — Other Ambulatory Visit: Payer: Self-pay | Admitting: *Deleted

## 2014-08-25 DIAGNOSIS — E038 Other specified hypothyroidism: Secondary | ICD-10-CM

## 2014-08-25 MED ORDER — LEVOTHYROXINE SODIUM 50 MCG PO TABS: 50.0000 ug | ORAL_TABLET | Freq: Every day | ORAL | Status: DC

## 2014-10-13 ENCOUNTER — Other Ambulatory Visit (INDEPENDENT_AMBULATORY_CARE_PROVIDER_SITE_OTHER)

## 2014-10-13 DIAGNOSIS — E039 Hypothyroidism, unspecified: Secondary | ICD-10-CM | POA: Diagnosis not present

## 2014-10-14 ENCOUNTER — Other Ambulatory Visit: Payer: Self-pay | Admitting: Family Medicine

## 2014-10-14 LAB — TSH: TSH: 6.874 u[IU]/mL — ABNORMAL HIGH (ref 0.350–4.500)

## 2014-12-07 ENCOUNTER — Telehealth: Payer: Self-pay

## 2014-12-07 ENCOUNTER — Encounter: Admitting: Family Medicine

## 2014-12-07 MED ORDER — LEVOTHYROXINE SODIUM 50 MCG PO TABS: 50.0000 ug | ORAL_TABLET | Freq: Every day | ORAL | Status: DC

## 2014-12-07 NOTE — Telephone Encounter (Signed)
[facility] - [place], [place] - [address]: levothyroxine (SYNTHROID, LEVOTHROID) 50 MCG tablet

## 2014-12-07 NOTE — Telephone Encounter (Signed)
Rx done. 

## 2015-01-04 ENCOUNTER — Other Ambulatory Visit: Payer: Self-pay | Admitting: Family Medicine

## 2015-01-04 MED ORDER — LEVOTHYROXINE SODIUM 50 MCG PO TABS: 50.0000 ug | ORAL_TABLET | Freq: Every day | ORAL | Status: DC

## 2015-01-04 NOTE — Telephone Encounter (Signed)
Rx done. 

## 2015-04-18 ENCOUNTER — Other Ambulatory Visit: Payer: Self-pay | Admitting: Family Medicine

## 2015-04-18 ENCOUNTER — Encounter: Payer: Self-pay | Admitting: Family Medicine

## 2015-04-18 ENCOUNTER — Ambulatory Visit (INDEPENDENT_AMBULATORY_CARE_PROVIDER_SITE_OTHER): Admitting: Family Medicine

## 2015-04-18 VITALS — BP 102/70 | HR 67 | Temp 97.7°F | Ht 62.0 in | Wt 136.6 lb

## 2015-04-18 DIAGNOSIS — E785 Hyperlipidemia, unspecified: Secondary | ICD-10-CM | POA: Diagnosis not present

## 2015-04-18 DIAGNOSIS — F3342 Major depressive disorder, recurrent, in full remission: Secondary | ICD-10-CM

## 2015-04-18 DIAGNOSIS — R922 Inconclusive mammogram: Secondary | ICD-10-CM | POA: Diagnosis not present

## 2015-04-18 DIAGNOSIS — Z0000 Encounter for general adult medical examination without abnormal findings: Secondary | ICD-10-CM

## 2015-04-18 DIAGNOSIS — R9230 Dense breasts, unspecified: Secondary | ICD-10-CM

## 2015-04-18 DIAGNOSIS — R921 Mammographic calcification found on diagnostic imaging of breast: Secondary | ICD-10-CM

## 2015-04-18 DIAGNOSIS — E039 Hypothyroidism, unspecified: Secondary | ICD-10-CM

## 2015-04-18 DIAGNOSIS — Z1239 Encounter for other screening for malignant neoplasm of breast: Secondary | ICD-10-CM | POA: Diagnosis not present

## 2015-04-18 DIAGNOSIS — N6320 Unspecified lump in the left breast, unspecified quadrant: Secondary | ICD-10-CM

## 2015-04-18 LAB — LIPID PANEL
CHOL/HDL RATIO: 5
Cholesterol: 232 mg/dL — ABNORMAL HIGH (ref 0–200)
HDL: 50.5 mg/dL (ref >39.00)
LDL Cholesterol: 154 mg/dL — ABNORMAL HIGH (ref 0–99)
NONHDL: 181
Triglycerides: 137 mg/dL (ref 0.0–149.0)
VLDL: 27.4 mg/dL (ref 0.0–40.0)

## 2015-04-18 LAB — TSH: TSH: 8.72 u[IU]/mL — ABNORMAL HIGH (ref 0.35–4.50)

## 2015-04-18 LAB — HEMOGLOBIN A1C: Hgb A1c MFr Bld: 5.6 % (ref 4.6–6.5)

## 2015-04-18 NOTE — Patient Instructions (Addendum)
BEFORE YOU LEAVE: -labs -follow up in 6 months  -We placed a referral for you as discussed for the mammogram. Make sure you follow up on all mammograms. It usually takes about 1-2 weeks to process and schedule this referral. If you have not heard from [place] regarding this appointment in 2 weeks please contact our office.  -Consider genetic testing and call me if you wish to do this so that I can place a referral.  -complete the stool cards and return  -try trial off dairy for 2 weeks, if not improved please call and we will refer to the gastroenterologist  -We have ordered labs or studies at this visit. It can take up to 1-2 weeks for results and processing. We will contact you with instructions IF your results are abnormal. Normal results will be released to your [facility]. If you have not heard from [place] or can not find your results in [facility] in 2 weeks please contact our office.  We recommend the following healthy lifestyle measures: - eat a healthy whole foods diet consisting of regular small meals composed of vegetables, fruits, beans, nuts, seeds, healthy meats such as white chicken and fish and whole grains.  - avoid sweets, white starchy foods, fried foods, fast food, processed foods, sodas, red meet and other fattening foods.  - get a least 150-300 minutes of aerobic exercise per week.

## 2015-04-18 NOTE — Progress Notes (Addendum)
HPI:  Here for CPE:  -Concerns and/or follow up today:   Hypothyroid: -mild - dx in [place] and on supplements prior to seeing me -started very low dose synthroid 04/2014 -reports: she is feeling better on the synthroid - energy is improved - occ fatigue from time to time but much better -denies: palpitations, constipation, swelling  Hx Depression: -did counseling -reports: doing well -denies: SI, depressed mood  Hyperlipidemia: Meds: pravastatin - upset her stomach -is eating healthy and getting regular exercise  -Taking folic acid, vitamin D or calcium: no  -Diabetes and Dyslipidemia Screening: FASTING today  -Hx of HTN: no  -Vaccines: declined flu  -pap history: s/p abd hysterectomy and oophorectomy for fibroids  -FDLMP: n/a  -sexual activity: yes, female partner, no new partners  -wants STI testing (Hep C if born 50-65): no  -FH breast, colon or ovarian ca: see FH Last mammogram: needs order - reports tried to schedule and was told needed to see her doctor for an order. Sister had breast ca recently.  Last colon cancer screening: she reports did in 2009 in [place] and this was normal - she does not have these records and reports no way of getting this report. No hx of colon cancer in her family.   -Alcohol, Tobacco, drug use: see social history  Review of Systems - no fevers, unintentional weight loss, vision loss, hearing loss, chest pain, sob, hemoptysis, melena, hematochezia, hematuria, genital discharge, changing or concerning skin lesions, bleeding, bruising, loc, thoughts of self harm or SI  Past Medical History  Diagnosis Date  . Arthritis   . Chicken pox   . Heart murmur     since childhood, benign  . Diverticulosis   . Breast CA [facility])     sister    Past Surgical History  Procedure Laterality Date  . Abdominal hysterectomy    . Ovaries removed  1994  . Breast biopsy  1973    benign  . Intramedullary (im) nail intertrochanteric Left 09/17/2013     Procedure: INTRAMEDULLARY (IM) NAIL INTERTROCHANTRIC;  Surgeon: [name], MD;  Location: [facility];  Service: Orthopedics;  Laterality: Left;    Family History  Problem Relation Age of Onset  . Arthritis      parent/grandparent  . Heart disease      parent/grandparent  . Diabetes Other   . Heart disease Mother 81    MI  . COPD Father   . Osteoarthritis Father   . Diabetes Sister   . Asthma Sister     Social History   Social History  . Marital Status: Married    Spouse Name: N/A  . Number of Children: N/A  . Years of Education: N/A   Social History Main Topics  . Smoking status: Never Smoker   . Smokeless tobacco: None  . Alcohol Use: No  . Drug Use: None  . Sexual Activity: Not Asked   Other Topics Concern  . None   Social History Narrative   Work or School: homemaker, [employer] in [place]      Home Situation: lives with husband      Spiritual Beliefs: Mina Marble, Darrick Meigs      Lifestyle: elliptical 20 minutes a few times per week, very active - gardening, planting, healthy diet                 Current outpatient prescriptions:  .  CALCIUM PO, Take by mouth daily., Disp: , Rfl:  .  Cholecalciferol (VITAMIN D3  PO), Take by mouth daily., Disp: , Rfl:  .  COCONUT OIL PO, Take by mouth., Disp: , Rfl:  .  Cyanocobalamin (VITAMIN B-12 PO), Take by mouth daily., Disp: , Rfl:  .  Glucosamine-Chondroitin (GLUCOSAMINE CHONDR COMPLEX PO), Take by mouth daily., Disp: , Rfl:  .  levothyroxine (SYNTHROID, LEVOTHROID) 50 MCG tablet, Take 1 tablet (50 mcg total) by mouth daily., Disp: 30 tablet, Rfl: 3 .  NON FORMULARY, daily. T-balance plus (for thyroid function), Disp: , Rfl:  .  traMADol (ULTRAM) 50 MG tablet, Take by mouth every 6 (six) hours as needed., Disp: , Rfl:   EXAM:  Filed Vitals:   04/18/15 0901  BP: 102/70  Pulse: 67  Temp: 97.7 F (36.5 C)    GENERAL: vitals reviewed and listed below, alert, oriented, appears well  hydrated and in no acute distress  HEENT: head atraumatic, PERRLA, normal appearance of eyes, ears, nose and mouth. moist mucus membranes.  NECK: supple, no masses or lymphadenopathy  LUNGS: clear to auscultation bilaterally, no rales, rhonchi or wheeze  CV: HRRR, no peripheral edema or cyanosis, normal pedal pulses  BREAST:  declined  ABDOMEN: bowel sounds normal, soft, non tender to palpation, no masses, no rebound or guarding  GU: declined  SKIN: no rash or abnormal lesions  MS: normal gait, moves all extremities normally  NEURO: CN II-XII grossly intact, normal muscle strength and sensation to light touch on extremities  PSYCH: normal affect, pleasant and cooperative  ASSESSMENT AND PLAN:  Discussed the following assessment and plan:  Visit for preventive health examination - Plan: Hemoglobin A1c -Discussed and advised all [place] preventive services health task force level A and B recommendations for age, sex and risks. -Advised at least 150 minutes of exercise per week and a healthy diet low in saturated fats and sweets and consisting of fresh fruits and vegetables, lean meats such as fish and white chicken and whole grains. -labs, studies and vaccines per orders this encounter   Dense breasts - Plan: MM Digital Diagnostic Unilat L, MM Digital Screening Breast cancer screening - Plan: MM Digital Diagnostic Unilat L, MM Digital Screening -advised staff to assist her with scheduling this -explained especially with sister with breast ca - new information today that she is at higher risk for breast ca and advised yearly exam, following up on all mammograms and genetic counseling discussed -she agrees to do mammot gram and order placed. Declined CBE. Declined genetic testing for now but will consider. Advised to call if she wishes to do this.  Recurrent major depressive disorder, in full remission ([place]) -stable  Hypothyroidism, unspecified hypothyroidism type - Plan:  TSH -stable, check labs  Hyperlipidemia - Plan: Lipid Panel -labs, trial different statin if needed  Gas - after eating dairy mentioned at end of appt. No other GI issues, changes in bowels of melena. Trial of dairy and advised to cal in 2 weeks if persists. Stool cards vs colonoscopy since do not have report. She wants to do stool cards and colonoscopy in 2019.   Orders Placed This Encounter  Procedures  . MM Digital Diagnostic Unilat L    Standing Status: Future     Number of Occurrences:      Standing Expiration Date: 06/17/2016    Order Specific Question:  Reason for Exam (SYMPTOM  OR DIAGNOSIS REQUIRED)    Answer:  breast ca screening    Order Specific Question:  Preferred imaging location?    Answer:  [facility]  . MM  Digital Screening    Standing Status: Future     Number of Occurrences:      Standing Expiration Date: 06/17/2016    Order Specific Question:  Reason for Exam (SYMPTOM  OR DIAGNOSIS REQUIRED)    Answer:  breast ca screening    Order Specific Question:  Preferred imaging location?    Answer:  [facility]  . Lipid Panel  . TSH  . Hemoglobin A1c    Patient advised to return to clinic immediately if symptoms worsen or persist or new concerns.  Patient Instructions  BEFORE YOU LEAVE: -labs -follow up in 6 months  -We placed a referral for you as discussed for the mammogram. Make sure you follow up on all mammograms. It usually takes about 1-2 weeks to process and schedule this referral. If you have not heard from [place] regarding this appointment in 2 weeks please contact our office.  -Consider genetic testing and call me if you wish to do this so that I can place a referral.  -complete the stool cards and return  -try trial off dairy for 2 weeks, if not improved please call and we will refer to the gastroenterologist  -We have ordered labs or studies at this visit. It can take up to 1-2 weeks for results and processing. We will contact you with  instructions IF your results are abnormal. Normal results will be released to your [facility]. If you have not heard from [place] or can not find your results in [facility] in 2 weeks please contact our office.  We recommend the following healthy lifestyle measures: - eat a healthy whole foods diet consisting of regular small meals composed of vegetables, fruits, beans, nuts, seeds, healthy meats such as white chicken and fish and whole grains.  - avoid sweets, white starchy foods, fried foods, fast food, processed foods, sodas, red meet and other fattening foods.  - get a least 150-300 minutes of aerobic exercise per week.           No Follow-up on file.  Colin Benton R.

## 2015-04-18 NOTE — Progress Notes (Signed)
Pre visit review using our clinic review tool, if applicable. No additional management support is needed unless otherwise documented below in the visit note. 

## 2015-04-24 ENCOUNTER — Other Ambulatory Visit: Payer: Self-pay | Admitting: *Deleted

## 2015-04-24 DIAGNOSIS — E039 Hypothyroidism, unspecified: Secondary | ICD-10-CM

## 2015-04-24 DIAGNOSIS — E785 Hyperlipidemia, unspecified: Secondary | ICD-10-CM

## 2015-04-24 MED ORDER — LEVOTHYROXINE SODIUM 75 MCG PO TABS: 75.0000 ug | ORAL_TABLET | Freq: Every day | ORAL | Status: DC

## 2015-04-24 MED ORDER — ROSUVASTATIN CALCIUM 5 MG PO TABS: 5.0000 mg | ORAL_TABLET | Freq: Every day | ORAL | Status: DC

## 2015-04-24 NOTE — Addendum Note (Signed)
Addended by: Lahoma Crocker A on: 04/24/2015 11:02 AM   Modules accepted: Orders, Medications

## 2015-04-25 ENCOUNTER — Encounter

## 2015-04-25 ENCOUNTER — Other Ambulatory Visit

## 2015-04-26 ENCOUNTER — Ambulatory Visit
Admission: RE | Admit: 2015-04-26 | Discharge: 2015-04-26 | Disposition: A | Discharge status: Home / Self Care | Source: Ambulatory Visit | Attending: Family Medicine | Admitting: Family Medicine

## 2015-04-26 DIAGNOSIS — R921 Mammographic calcification found on diagnostic imaging of breast: Secondary | ICD-10-CM

## 2015-04-26 DIAGNOSIS — N6320 Unspecified lump in the left breast, unspecified quadrant: Secondary | ICD-10-CM

## 2015-05-01 ENCOUNTER — Other Ambulatory Visit (INDEPENDENT_AMBULATORY_CARE_PROVIDER_SITE_OTHER)

## 2015-05-01 DIAGNOSIS — K921 Melena: Secondary | ICD-10-CM

## 2015-05-01 LAB — POC HEMOCCULT BLD/STL (HOME/3-CARD/SCREEN)
Card #3 Fecal Occult Blood, POC: NEGATIVE
FECAL OCCULT BLD: NEGATIVE
Fecal Occult Blood, POC: NEGATIVE

## 2015-07-25 ENCOUNTER — Other Ambulatory Visit (INDEPENDENT_AMBULATORY_CARE_PROVIDER_SITE_OTHER)

## 2015-07-25 DIAGNOSIS — E039 Hypothyroidism, unspecified: Secondary | ICD-10-CM | POA: Diagnosis not present

## 2015-07-25 DIAGNOSIS — E785 Hyperlipidemia, unspecified: Secondary | ICD-10-CM | POA: Diagnosis not present

## 2015-07-25 LAB — TSH: TSH: 9.53 u[IU]/mL — AB (ref 0.35–4.50)

## 2015-07-25 LAB — LIPID PANEL
CHOLESTEROL: 221 mg/dL — AB (ref 0–200)
HDL: 43.9 mg/dL (ref >39.00)
LDL Cholesterol: 154 mg/dL — ABNORMAL HIGH (ref 0–99)
NonHDL: 177.31
Total CHOL/HDL Ratio: 5
Triglycerides: 119 mg/dL (ref 0.0–149.0)
VLDL: 23.8 mg/dL (ref 0.0–40.0)

## 2015-07-26 MED ORDER — LEVOTHYROXINE SODIUM 100 MCG PO TABS: 100.0000 ug | ORAL_TABLET | Freq: Every day | ORAL | Status: DC

## 2015-07-26 MED ORDER — ROSUVASTATIN CALCIUM 20 MG PO TABS: 20.0000 mg | ORAL_TABLET | Freq: Every day | ORAL | Status: DC

## 2015-07-26 MED ORDER — ATORVASTATIN CALCIUM 10 MG PO TABS: 10.0000 mg | ORAL_TABLET | Freq: Every day | ORAL | Status: DC

## 2015-07-26 NOTE — Addendum Note (Signed)
Addended by: Lahoma Crocker A on: 07/26/2015 11:08 AM   Modules accepted: Orders

## 2016-04-25 ENCOUNTER — Other Ambulatory Visit: Payer: Self-pay | Admitting: Family Medicine

## 2016-05-14 ENCOUNTER — Other Ambulatory Visit: Payer: Self-pay | Admitting: Family Medicine

## 2016-05-14 DIAGNOSIS — Z1231 Encounter for screening mammogram for malignant neoplasm of breast: Secondary | ICD-10-CM

## 2016-06-16 ENCOUNTER — Ambulatory Visit
Admission: RE | Admit: 2016-06-16 | Discharge: 2016-06-16 | Disposition: A | Discharge status: Home / Self Care | Source: Ambulatory Visit | Attending: Family Medicine | Admitting: Family Medicine

## 2016-06-16 DIAGNOSIS — Z1231 Encounter for screening mammogram for malignant neoplasm of breast: Secondary | ICD-10-CM

## 2016-08-11 ENCOUNTER — Other Ambulatory Visit: Payer: Self-pay | Admitting: Orthopedic Surgery

## 2016-08-13 ENCOUNTER — Other Ambulatory Visit: Payer: Self-pay | Admitting: Orthopedic Surgery

## 2016-08-13 DIAGNOSIS — M170 Bilateral primary osteoarthritis of knee: Secondary | ICD-10-CM

## 2016-08-15 ENCOUNTER — Ambulatory Visit
Admission: RE | Admit: 2016-08-15 | Discharge: 2016-08-15 | Disposition: A | Discharge status: Home / Self Care | Source: Ambulatory Visit | Attending: Orthopedic Surgery | Admitting: Orthopedic Surgery

## 2016-08-15 DIAGNOSIS — M170 Bilateral primary osteoarthritis of knee: Secondary | ICD-10-CM

## 2016-08-28 ENCOUNTER — Encounter: Payer: Self-pay | Admitting: Family Medicine

## 2016-08-28 ENCOUNTER — Ambulatory Visit (INDEPENDENT_AMBULATORY_CARE_PROVIDER_SITE_OTHER): Admitting: Family Medicine

## 2016-08-28 VITALS — BP 108/62 | HR 65 | Temp 97.7°F | Ht 62.0 in | Wt 137.5 lb

## 2016-08-28 DIAGNOSIS — M1711 Unilateral primary osteoarthritis, right knee: Secondary | ICD-10-CM | POA: Diagnosis not present

## 2016-08-28 DIAGNOSIS — M47812 Spondylosis without myelopathy or radiculopathy, cervical region: Secondary | ICD-10-CM

## 2016-08-28 DIAGNOSIS — Z01818 Encounter for other preprocedural examination: Secondary | ICD-10-CM | POA: Diagnosis not present

## 2016-08-28 DIAGNOSIS — Z23 Encounter for immunization: Secondary | ICD-10-CM

## 2016-08-28 DIAGNOSIS — E039 Hypothyroidism, unspecified: Secondary | ICD-10-CM | POA: Diagnosis not present

## 2016-08-28 NOTE — Progress Notes (Signed)
Pre visit review using our clinic review tool, if applicable. No additional management support is needed unless otherwise documented below in the visit note. 

## 2016-08-28 NOTE — Progress Notes (Signed)
HPI:  Patient is seen for optimization of general medical care prior to surgery. Surgery type: R knee partial Date of surgery: 10/22/16  Kidney disease? No Prior surgeries/Issues following anesthesia? Hip, and several abd surgeries - did well with all per her report without complications Hx MI, heart arrythmia, CHF, angina or stroke? none Epilepsy or Seizures? none Arthritis or problems with neck or jaw? OA neck Thyroid disease? Yes, dose recently changed on synthroid Liver disease? none Asthma, COPD or chronic lung disease? none Diabetes? none (Needs to be evaluated by anesthesia if yes to these questions.)  Other: Poor nutrition, Frail or other: no  METS:  ?Can take care of self, such as eat, dress, or use the toilet (1 MET). yes ?Can walk up a flight of steps or a hill (4 METs).yes ?Can do heavy work around the house such as scrubbing floors or lifting or moving heavy furniture (between 4 and 10 METs). yes ?Can participate in strenuous sports such as swimming, singles tennis, football, basketball, and skiing (>10 METs) yes - can swim . AHA Risks: Major predictors that require intensive management and may lead to delay in or cancellation of the operative procedure unless emergent: NONE of the following per pt  . Unstable coronary syndromes including unstable or severe angina or recent MI  . Decompensated heart failure including NYHA functional class IV or worsening or new-onset HF  . Significant arrhythmias including high grade AV block, symptomatic ventricular arrhythmias, supraventricular arrhythmias with ventricular rate >100 bpm at rest, symptomatic bradycardia, and newly recognized ventricular tachycardia  . Severe heart valve disease including severe aortic stenosis or symptomatic mitral stenosis   Other clinical predictors that warrant careful assessment of current status: NONE of the following per pt  . History of ischemic heart disease . History of cerebrovascular  disease  . History of compensated heart failure or prior heart failure  . Diabetes mellitus  . Renal insufficiency  Type of surgery and Risk: 1) High risk (reported risk of cardiac death or nonfatal myocardial infarction [MI] often greater than 5 percent):  Marland Kitchen Aortic and other major vascular surgery  . Peripheral artery surgery   2)Intermediate risk (reported risk of cardiac death or nonfatal MI generally 1 to 5 percent):  Marland Kitchen Carotid endarterectomy  . Head and neck surgery  . Intraperitoneal and intrathoracic surgery  . Orthopedic surgery  . Prostate surgery   3)Low risk (reported risk of cardiac death or nonfatal MI generally less than 1 percent):  Marland Kitchen Ambulatory surgery  . Endoscopic procedures  . Superficial procedure  . Cataract surgery  . Breast surgery  Medications that need to be addressed prior to surgery: supplements Discontinue acei/arbs/non-statin lipid lowering drugs day of surgery ASA stop 7 days before or discuss with cardiology if CV risks, other anticoagulants discuss with cardiology.  ROS: See pertinent positives and negatives per HPI. 11 point ROS negative except where noted.  Past Medical History:  Diagnosis Date  . Arthritis   . Breast CA ([place])    sister  . Chicken pox   . Diverticulosis   . Heart murmur    since childhood, benign    Past Surgical History:  Procedure Laterality Date  . ABDOMINAL HYSTERECTOMY    . BREAST BIOPSY  1973   benign  . INTRAMEDULLARY (IM) NAIL INTERTROCHANTERIC Left 09/17/2013   Procedure: INTRAMEDULLARY (IM) NAIL INTERTROCHANTRIC;  Surgeon: [name], MD;  Location: [facility];  Service: Orthopedics;  Laterality: Left;  . ovaries removed  1994  Family History  Problem Relation Age of Onset  . Arthritis      parent/grandparent  . Heart disease      parent/grandparent  . Diabetes Other   . Heart disease Mother 19    MI  . COPD Father   . Osteoarthritis Father   . Diabetes Sister   . Asthma Sister     Social  History   Social History  . Marital status: Married    Spouse name: N/A  . Number of children: N/A  . Years of education: N/A   Social History Main Topics  . Smoking status: Never Smoker  . Smokeless tobacco: Never Used  . Alcohol use No  . Drug use: Unknown  . Sexual activity: Not Asked   Other Topics Concern  . None   Social History Narrative   Work or School: homemaker, [employer] in [place]      Home Situation: lives with husband      Spiritual Beliefs: Mina Marble, Darrick Meigs      Lifestyle: elliptical 20 minutes a few times per week, very active - gardening, planting, healthy diet                 Current Outpatient Prescriptions:  .  CALCIUM PO, Take by mouth daily., Disp: , Rfl:  .  Cholecalciferol (VITAMIN D3 PO), Take by mouth daily., Disp: , Rfl:  .  COCONUT OIL PO, Take by mouth., Disp: , Rfl:  .  Cyanocobalamin (VITAMIN B-12 PO), Take by mouth daily., Disp: , Rfl:  .  Glucosamine-Chondroitin (GLUCOSAMINE CHONDR COMPLEX PO), Take by mouth daily., Disp: , Rfl:  .  levothyroxine (SYNTHROID, LEVOTHROID) 100 MCG tablet, TAKE ONE TABLET BY MOUTH ONCE DAILY, Disp: 90 tablet, Rfl: 3 .  NON FORMULARY, daily. T-balance plus (for thyroid function), Disp: , Rfl:  .  traMADol (ULTRAM) 50 MG tablet, Take by mouth every 6 (six) hours as needed., Disp: , Rfl:   EXAM:  Vitals:   08/28/16 1440  BP: 108/62  Pulse: 65  Temp: 97.7 F (36.5 C)    Body mass index is 25.15 kg/m.  GENERAL: vitals reviewed and listed above, alert, oriented, appears well hydrated and in no acute distress  HEENT: atraumatic, conjunttiva clear, no obvious abnormalities on inspection of external nose and ears  NECK: no obvious masses on inspection, no carotid bruits  LUNGS: clear to auscultation bilaterally, no wheezes, rales or rhonchi, good air movement  CV: HRRR, no peripheral edema, no JVD, BP normal range, normal radial pulses  MS: moves all extremities without  noticeable abnormality  PSYCH: pleasant and cooperative, no obvious depression or anxiety  ASSESSMENT AND PLAN:  Discussed the following assessment and plan:  Preop examination  Osteoarthritis of right knee, unspecified osteoarthritis type  Hypothyroidism, unspecified type  Osteoarthritis of cervical spine, unspecified spinal osteoarthritis complication status  Assessment: -Preoperative evaluation for optimization of medical care prior to surgery -Major cardiac risk factors: none -Surgery Risks:intermediate -age, nutritional status, fraility: good nutritional status, age <65, no fraility -functional capacity: > 6 METs wethout symptoms -comorbidities: OA of the neck, Hypothyroidism Patient Specific Risks: patient is low risk for intermediate risks surgery   Recommendations for optimizing general medical care prior to surgery: -CPE with labs - she prefers to do labs at CPE when fasting - will schedule this prior to her surgery  -anesthesia consult prior to surgery given history of neck OA and thyroid disease -advised patient to discuss specific risks morbidity and mortality of surgery  with surgeon, CV risks discussed with patient -advised patient will defer to surgeon for post-op DVT prophylaxis and post op care -no specific medical recommendations for this patient at this time and no recommendations to defer surgery or for further CV testing prior to surgery -advised to hold supplements and aspirin 1 week prior to surgery and follow surgeon's recommendations regarding all medications -form for pre-op optimization of general medical care prior to surgery and this office visit note with recommendations will be faxed to surgeon's office promptly   -Patient advised to return or notify a doctor immediately if symptoms worsen or persist or new concerns arise.  Patient Instructions  BEFORE YOU LEAVE: -physical exam with labs in 1 month - morning appointment - ok to create block if  needed       Joanne Rambeau R.

## 2016-08-28 NOTE — Patient Instructions (Addendum)
BEFORE YOU LEAVE: -physical exam with labs in 1 month - morning appointment - ok to create block if needed

## 2016-09-17 ENCOUNTER — Encounter: Payer: Self-pay | Admitting: Family Medicine

## 2016-09-17 ENCOUNTER — Ambulatory Visit (INDEPENDENT_AMBULATORY_CARE_PROVIDER_SITE_OTHER): Admitting: Family Medicine

## 2016-09-17 VITALS — BP 122/60 | HR 66 | Temp 97.8°F | Ht 62.0 in | Wt 137.3 lb

## 2016-09-17 DIAGNOSIS — E039 Hypothyroidism, unspecified: Secondary | ICD-10-CM | POA: Diagnosis not present

## 2016-09-17 DIAGNOSIS — E785 Hyperlipidemia, unspecified: Secondary | ICD-10-CM

## 2016-09-17 DIAGNOSIS — Z0000 Encounter for general adult medical examination without abnormal findings: Secondary | ICD-10-CM | POA: Diagnosis not present

## 2016-09-17 DIAGNOSIS — M170 Bilateral primary osteoarthritis of knee: Secondary | ICD-10-CM

## 2016-09-17 LAB — LIPID PANEL
CHOLESTEROL: 227 mg/dL — AB (ref 0–200)
HDL: 43.2 mg/dL (ref >39.00)
NonHDL: 183.37
Total CHOL/HDL Ratio: 5
Triglycerides: 255 mg/dL — ABNORMAL HIGH (ref 0.0–149.0)
VLDL: 51 mg/dL — AB (ref 0.0–40.0)

## 2016-09-17 LAB — LDL CHOLESTEROL, DIRECT: Direct LDL: 123 mg/dL

## 2016-09-17 LAB — HEMOGLOBIN A1C: Hgb A1c MFr Bld: 5.7 % (ref 4.6–6.5)

## 2016-09-17 LAB — TSH: TSH: 3.07 u[IU]/mL (ref 0.35–4.50)

## 2016-09-17 NOTE — Patient Instructions (Signed)
BEFORE YOU LEAVE: -labs -follow up: 6 months  We have ordered labs or studies at this visit. It can take up to 1-2 weeks for results and processing. IF results require follow up or explanation, we will call you with instructions. Clinically stable results will be released to your [facility]. If you have not heard from [place] or cannot find your results in [facility] in 2 weeks please contact our office at [phone].  If you are not yet signed up for [facility], please consider signing up.   We recommend the following healthy lifestyle for LIFE: 1) Small portions.   Tip: eat off of a salad plate instead of a dinner plate.  Tip: if you need more or a snack choose fruits, veggies and/or a handful of nuts or seeds.  2) Eat a healthy clean diet.  * Tip: Avoid (less then 1 serving per week): processed foods, sweets, sweetened drinks, white starches (rice, flour, bread, potatoes, pasta, etc), red meat, fast foods, butter  *Tip: CHOOSE instead   * 5-9 servings per day of fresh or frozen fruits and vegetables (but not corn, potatoes, bananas, canned or dried fruit)   *nuts and seeds, beans   *olives and olive oil   *small portions of lean meats such as fish and white chicken    *small portions of whole grains  3)Get at least 150 minutes of sweaty aerobic exercise per week.  4)Reduce stress - consider counseling, meditation and relaxation to balance other aspects of your life.  WE NOW OFFER   [facility] FAST TRACK!!!  SAME DAY Appointments for ACUTE CARE  Such as: Sprains, Injuries, cuts, abrasions, rashes, muscle pain, joint pain, back pain Colds, flu, sore throats, headache, allergies, cough, fever  Ear pain, sinus and eye infections Abdominal pain, nausea, vomiting, diarrhea, upset stomach Animal/insect bites  3 Easy Ways to Schedule: Walk-In Scheduling Call in scheduling Mychart Sign-up: [email or web address]

## 2016-09-17 NOTE — Progress Notes (Signed)
Pre visit review using our clinic review tool, if applicable. No additional management support is needed unless otherwise documented below in the visit note. 

## 2016-09-17 NOTE — Progress Notes (Signed)
HPI:  Here for CPE:  -Concerns and/or follow up today:   Hypothyroid: -mild - dx in [place] and on supplements prior to seeing me -started very low dose synthroid 04/2014 -reports: she is feeling better on the synthroid - energy is improved - occ fatigue from time to time but much better  Hx Depression: -did counseling -resolved  Hyperlipidemia: Meds: pravastatin - upset her stomach, body aches - started low dose lipitor after last check - now not taking anything -is eating healthy, no exercise due to OA knees -she stopped her coconut oil  OA knees: -surgery planned -reports not able to do wt bearing exercise currently per ortho recs -sees Dr. Maureen Ralphs for management  -Taking folic acid, vitamin D or calcium: takes calcium and vit D  -Diabetes and Dyslipidemia Screening:fasting for labs  -Hx of HTN: no  -Vaccines: UTD  -pap history: n/a; s/p complete abd hysterectomy and oophorectomy for fibroids  -sexual activity: yes, female partner, no new partners  -wants STI testing (Hep C if born [birth year]): no  -FH breast, colon or ovarian ca: see FH Last mammogram: pt does not feel genetic indication, she is doing mammogram yearly Last colon cancer screening: 2009 in [place] per her report and normal, she wants to consider cologard next year  -Alcohol, Tobacco, drug use: see social history  Review of Systems - no fevers, unintentional weight loss, vision loss, hearing loss, chest pain, sob, hemoptysis, melena, hematochezia, hematuria, genital discharge, changing or concerning skin lesions, bleeding, bruising, loc, thoughts of self harm or SI  Past Medical History:  Diagnosis Date  . Arthritis   . Breast CA ([place])    sister  . Chicken pox   . Diverticulosis   . Heart murmur    since childhood, benign    Past Surgical History:  Procedure Laterality Date  . ABDOMINAL HYSTERECTOMY    . BREAST BIOPSY  1973   benign  . INTRAMEDULLARY (IM) NAIL INTERTROCHANTERIC Left  09/17/2013   Procedure: INTRAMEDULLARY (IM) NAIL INTERTROCHANTRIC;  Surgeon: [name], MD;  Location: [facility];  Service: Orthopedics;  Laterality: Left;  . ovaries removed  1994    Family History  Problem Relation Age of Onset  . Arthritis      parent/grandparent  . Heart disease      parent/grandparent  . Diabetes Other   . Heart disease Mother 60    MI  . COPD Father   . Osteoarthritis Father   . Diabetes Sister   . Asthma Sister     Social History   Social History  . Marital status: Married    Spouse name: N/A  . Number of children: N/A  . Years of education: N/A   Social History Main Topics  . Smoking status: Never Smoker  . Smokeless tobacco: Never Used  . Alcohol use No  . Drug use: Unknown  . Sexual activity: Not Asked   Other Topics Concern  . None   Social History Narrative   Work or School: homemaker, [employer] in [place]      Home Situation: lives with husband      Spiritual Beliefs: Mina Marble, Darrick Meigs      Lifestyle: elliptical 20 minutes a few times per week, very active - gardening, planting, healthy diet                 Current Outpatient Prescriptions:  .  CALCIUM PO, Take by mouth daily., Disp: , Rfl:  .  Cholecalciferol (VITAMIN D3 PO),  Take by mouth daily., Disp: , Rfl:  .  COCONUT OIL PO, Take by mouth., Disp: , Rfl:  .  Cyanocobalamin (VITAMIN B-12 PO), Take by mouth daily., Disp: , Rfl:  .  Glucosamine-Chondroitin (GLUCOSAMINE CHONDR COMPLEX PO), Take by mouth daily., Disp: , Rfl:  .  levothyroxine (SYNTHROID, LEVOTHROID) 100 MCG tablet, TAKE ONE TABLET BY MOUTH ONCE DAILY, Disp: 90 tablet, Rfl: 3 .  NON FORMULARY, daily. T-balance plus (for thyroid function), Disp: , Rfl:  .  traMADol (ULTRAM) 50 MG tablet, Take by mouth every 6 (six) hours as needed., Disp: , Rfl:   EXAM:  Vitals:   09/17/16 0813  BP: 122/60  Pulse: 66  Temp: 97.8 F (36.6 C)    GENERAL: vitals reviewed and listed below, alert,  oriented, appears well hydrated and in no acute distress  HEENT: head atraumatic, PERRLA, normal appearance of eyes, ears, nose and mouth. moist mucus membranes.  NECK: supple, no masses or lymphadenopathy  LUNGS: clear to auscultation bilaterally, no rales, rhonchi or wheeze  CV: HRRR, no peripheral edema or cyanosis, normal pedal pulses  ABDOMEN: bowel sounds normal, soft, non tender to palpation, no masses, no rebound or guarding  SKIN: no rash or abnormal lesions, declined full skin exam  MS: normal gait, moves all extremities normally  GU/BREAST declined  NEURO: normal gait, speech and thought processing grossly intact, muscle tone grossly intact throughout  PSYCH: normal affect, pleasant and cooperative  ASSESSMENT AND PLAN:  Discussed the following assessment and plan:  Encounter for preventive health examination - Plan: Hemoglobin A1c  Hyperlipidemia, unspecified hyperlipidemia type - Plan: Lipid Panel  Hypothyroidism, unspecified type - Plan: TSH  Osteoarthritis of both knees, unspecified osteoarthritis type  -Discussed and advised all [place] preventive services health task force level A and B recommendations for age, sex and risks.  -discussed management OA pain, advised of risks various options, advised improtance of staying active and advise chair exercises, hand weights, swimming if able, ab/core workouts if unable to do weight bearing exercise  -Advised at least 150 minutes of exercise per week and a healthy diet with avoidance of (less then 1 serving per week) processed foods, white starches, red meat, fast foods and sweets and consisting of: * 5-9 servings of fresh fruits and vegetables (not corn or potatoes) *nuts and seeds, beans *olives and olive oil *lean meats such as fish and white chicken  *whole grains  -discussed current guidelines on calcium and vit D  -labs, studies and vaccines per orders this encounter  No orders of the defined types were  placed in this encounter.   Patient advised to return to clinic immediately if symptoms worsen or persist or new concerns.  There are no Patient Instructions on file for this visit.  No Follow-up on file.  Colin Benton R., DO

## 2016-09-18 ENCOUNTER — Encounter (HOSPITAL_COMMUNITY): Payer: Self-pay | Admitting: *Deleted

## 2016-10-02 ENCOUNTER — Ambulatory Visit: Payer: Self-pay | Admitting: Orthopedic Surgery

## 2016-10-14 NOTE — Patient Instructions (Addendum)
Joanne [facility]n  10/14/2016   Your procedure is scheduled on: 10-22-16  Report to [facility]  Entrance Report to Admitting at 6 AM  Call this number if you have problems the morning of surgery [phone]   Remember: ONLY 1 PERSON MAY GO WITH YOU TO SHORT STAY TO GET  READY MORNING OF YOUR SURGERY.  Do not eat food or drink liquids :After Midnight.     Take these medicines the morning of surgery with A SIP OF WATER: Levothyroxine (Synthroid)                               You may not have any metal on your body including hair pins and              piercings  Do not wear jewelry, make-up, lotions, powders or perfumes, deodorant             Do not wear nail polish.  Do not shave  48 hours prior to surgery.                Do not bring valuables to the [facility]. [facility].  Contacts, dentures or bridgework may not be worn into surgery.  Leave suitcase in the car. After surgery it may be brought to your room.                  Please read over the following fact sheets you were given: _____________________________________________________________________             [facility] - Preparing for Surgery Before surgery, you can play an important role.  Because skin is not sterile, your skin needs to be as free of germs as possible.  You can reduce the number of germs on your skin by washing with CHG (chlorahexidine gluconate) soap before surgery.  CHG is an antiseptic cleaner which kills germs and bonds with the skin to continue killing germs even after washing. Please DO NOT use if you have an allergy to CHG or antibacterial soaps.  If your skin becomes reddened/irritated stop using the CHG and inform your nurse when you arrive at Short Stay. Do not shave (including legs and underarms) for at least 48 hours prior to the first CHG shower.  You may shave your face/neck. Please follow these instructions  carefully:  1.  Shower with CHG Soap the night before surgery and the  morning of Surgery.  2.  If you choose to wash your hair, wash your hair first as usual with your  normal  shampoo.  3.  After you shampoo, rinse your hair and body thoroughly to remove the  shampoo.                           4.  Use CHG as you would any other liquid soap.  You can apply chg directly  to the skin and wash                       Gently with a scrungie or clean washcloth.  5.  Apply the CHG Soap to your body ONLY FROM THE NECK DOWN.   Do not use on face/ open  Wound or open sores. Avoid contact with eyes, ears mouth and genitals (private parts).                       Wash face,  Genitals (private parts) with your normal soap.             6.  Wash thoroughly, paying special attention to the area where your surgery  will be performed.  7.  Thoroughly rinse your body with warm water from the neck down.  8.  DO NOT shower/wash with your normal soap after using and rinsing off  the CHG Soap.                9.  Pat yourself dry with a clean towel.            10.  Wear clean pajamas.            11.  Place clean sheets on your bed the night of your first shower and do not  sleep with pets. Day of Surgery : Do not apply any lotions/deodorants the morning of surgery.  Please wear clean clothes to the [facility]/surgery center.  FAILURE TO FOLLOW THESE INSTRUCTIONS MAY RESULT IN THE CANCELLATION OF YOUR SURGERY PATIENT SIGNATURE_________________________________  NURSE SIGNATURE__________________________________  ________________________________________________________________________   [facility]  An incentive spirometer is a tool that can help keep your lungs clear and active. This tool measures how well you are filling your lungs with each breath. Taking long deep breaths may help reverse or decrease the chance of developing breathing (pulmonary) problems (especially infection)  following:  A long period of time when you are unable to move or be active. BEFORE THE PROCEDURE   If the spirometer includes an indicator to show your best effort, your nurse or respiratory therapist will set it to a desired goal.  If possible, sit up straight or lean slightly forward. Try not to slouch.  Hold the incentive spirometer in an upright position. INSTRUCTIONS FOR USE  1. Sit on the edge of your bed if possible, or sit up as far as you can in bed or on a chair. 2. Hold the incentive spirometer in an upright position. 3. Breathe out normally. 4. Place the mouthpiece in your mouth and seal your lips tightly around it. 5. Breathe in slowly and as deeply as possible, raising the piston or the ball toward the top of the column. 6. Hold your breath for 3-5 seconds or for as long as possible. Allow the piston or ball to fall to the bottom of the column. 7. Remove the mouthpiece from your mouth and breathe out normally. 8. Rest for a few seconds and repeat Steps 1 through 7 at least 10 times every 1-2 hours when you are awake. Take your time and take a few normal breaths between deep breaths. 9. The spirometer may include an indicator to show your best effort. Use the indicator as a goal to work toward during each repetition. 10. After each set of 10 deep breaths, practice coughing to be sure your lungs are clear. If you have an incision (the cut made at the time of surgery), support your incision when coughing by placing a pillow or rolled up towels firmly against it. Once you are able to get out of bed, walk around indoors and cough well. You may stop using the incentive spirometer when instructed by your caregiver.  RISKS AND COMPLICATIONS  Take your time so you do not get  dizzy or light-headed.  If you are in pain, you may need to take or ask for pain medication before doing incentive spirometry. It is harder to take a deep breath if you are having pain. AFTER USE  Rest and  breathe slowly and easily.  It can be helpful to keep track of a log of your progress. Your caregiver can provide you with a simple table to help with this. If you are using the spirometer at home, follow these instructions: [facility] IF:   You are having difficultly using the spirometer.  You have trouble using the spirometer as often as instructed.  Your pain medication is not giving enough relief while using the spirometer.  You develop fever of 100.5 F (38.1 C) or higher. SEEK IMMEDIATE MEDICAL [facility] IF:   You cough up bloody sputum that had not been present before.  You develop fever of 102 F (38.9 C) or greater.  You develop worsening pain at or near the incision site. MAKE SURE YOU:   Understand these instructions.  Will watch your condition.  Will get help right away if you are not doing well or get worse. Document Released: 11/03/2006 Document Revised: 09/15/2011 Document Reviewed: 01/04/2007 Exit[employer] Patient Information 2014 Exit[employer], [place].   ________________________________________________________________________  WHAT IS A BLOOD TRANSFUSION? Blood Transfusion Information  A transfusion is the replacement of blood or some of its parts. Blood is made up of multiple cells which provide different functions.  Red blood cells carry oxygen and are used for blood loss replacement.  White blood cells fight against infection.  Platelets control bleeding.  Plasma helps clot blood.  Other blood products are available for specialized needs, such as hemophilia or other clotting disorders. BEFORE THE TRANSFUSION  Who gives blood for transfusions?   Healthy volunteers who are fully evaluated to make sure their blood is safe. This is blood bank blood. Transfusion therapy is the safest it has ever been in the practice of medicine. Before blood is taken from a donor, a complete history is taken to make sure that person has no history of diseases nor engages in  risky social behavior (examples are intravenous drug use or sexual activity with multiple partners). The donor's travel history is screened to minimize risk of transmitting infections, such as malaria. The donated blood is tested for signs of infectious diseases, such as HIV and hepatitis. The blood is then tested to be sure it is compatible with you in order to minimize the chance of a transfusion reaction. If you or a relative donates blood, this is often done in anticipation of surgery and is not appropriate for emergency situations. It takes many days to process the donated blood. RISKS AND COMPLICATIONS Although transfusion therapy is very safe and saves many lives, the main dangers of transfusion include:   Getting an infectious disease.  Developing a transfusion reaction. This is an allergic reaction to something in the blood you were given. Every precaution is taken to prevent this. The decision to have a blood transfusion has been considered carefully by your caregiver before blood is given. Blood is not given unless the benefits outweigh the risks. AFTER THE TRANSFUSION  Right after receiving a blood transfusion, you will usually feel much better and more energetic. This is especially true if your red blood cells have gotten low (anemic). The transfusion raises the level of the red blood cells which carry oxygen, and this usually causes an energy increase.  The nurse administering the transfusion will  monitor you carefully for complications. HOME [facility] INSTRUCTIONS  No special instructions are needed after a transfusion. You may find your energy is better. Speak with your caregiver about any limitations on activity for underlying diseases you may have. SEEK MEDICAL [facility] IF:   Your condition is not improving after your transfusion.  You develop redness or irritation at the intravenous (IV) site. SEEK IMMEDIATE MEDICAL [facility] IF:  Any of the following symptoms occur over the next 12  hours:  Shaking chills.  You have a temperature by mouth above 102 F (38.9 C), not controlled by medicine.  Chest, back, or muscle pain.  People around you feel you are not acting correctly or are confused.  Shortness of breath or difficulty breathing.  Dizziness and fainting.  You get a rash or develop hives.  You have a decrease in urine output.  Your urine turns a dark color or changes to pink, red, or brown. Any of the following symptoms occur over the next 10 days:  You have a temperature by mouth above 102 F (38.9 C), not controlled by medicine.  Shortness of breath.  Weakness after normal activity.  The white part of the eye turns yellow (jaundice).  You have a decrease in the amount of urine or are urinating less often.  Your urine turns a dark color or changes to pink, red, or brown. Document Released: 06/20/2000 Document Revised: 09/15/2011 Document Reviewed: 02/07/2008 [facility] Patient Information 2014 [place], [place].  _______________________________________________________________________

## 2016-10-14 NOTE — Progress Notes (Signed)
08-28-16 Surgical clearance on chart from Dr. Maudie Mercury 09-17-16 (EPIC) [facility]

## 2016-10-16 ENCOUNTER — Encounter (HOSPITAL_COMMUNITY): Payer: Self-pay

## 2016-10-16 ENCOUNTER — Encounter (HOSPITAL_COMMUNITY)
Admission: RE | Admit: 2016-10-16 | Discharge: 2016-10-16 | Disposition: A | Discharge status: Home / Self Care | Source: Ambulatory Visit | Attending: Orthopedic Surgery | Admitting: Orthopedic Surgery

## 2016-10-16 DIAGNOSIS — M1711 Unilateral primary osteoarthritis, right knee: Secondary | ICD-10-CM | POA: Insufficient documentation

## 2016-10-16 DIAGNOSIS — Z01818 Encounter for other preprocedural examination: Secondary | ICD-10-CM | POA: Diagnosis present

## 2016-10-16 LAB — CBC
HEMATOCRIT: 40 % (ref 36.0–46.0)
HEMOGLOBIN: 13.2 g/dL (ref 12.0–15.0)
MCH: 30.4 pg (ref 26.0–34.0)
MCHC: 33 g/dL (ref 30.0–36.0)
MCV: 92.2 fL (ref 78.0–100.0)
Platelets: 239 10*3/uL (ref 150–400)
RBC: 4.34 MIL/uL (ref 3.87–5.11)
RDW: 12.6 % (ref 11.5–15.5)
WBC: 6.7 10*3/uL (ref 4.0–10.5)

## 2016-10-16 LAB — COMPREHENSIVE METABOLIC PANEL
ALK PHOS: 70 U/L (ref 38–126)
ALT: 16 U/L (ref 14–54)
ANION GAP: 7 (ref 5–15)
AST: 22 U/L (ref 15–41)
Albumin: 4.4 g/dL (ref 3.5–5.0)
BILIRUBIN TOTAL: 0.6 mg/dL (ref 0.3–1.2)
BUN: 15 mg/dL (ref 6–20)
CALCIUM: 9.1 mg/dL (ref 8.9–10.3)
CO2: 30 mmol/L (ref 22–32)
Chloride: 103 mmol/L (ref 101–111)
Creatinine, Ser: 0.71 mg/dL (ref 0.44–1.00)
Glucose, Bld: 90 mg/dL (ref 65–99)
POTASSIUM: 4.5 mmol/L (ref 3.5–5.1)
Sodium: 140 mmol/L (ref 135–145)
Total Protein: 7.7 g/dL (ref 6.5–8.1)

## 2016-10-16 LAB — PROTIME-INR
INR: 0.98
Prothrombin Time: 13 seconds (ref 11.4–15.2)

## 2016-10-16 LAB — APTT: APTT: 31 s (ref 24–36)

## 2016-10-16 LAB — ABO/RH: ABO/RH(D): O POS

## 2016-10-16 LAB — SURGICAL PCR SCREEN
MRSA, PCR: NEGATIVE
Staphylococcus aureus: NEGATIVE

## 2016-10-18 ENCOUNTER — Ambulatory Visit: Payer: Self-pay | Admitting: Orthopedic Surgery

## 2016-10-18 NOTE — H&P (Signed)
Joanne Vaughn DOB: [birth year] Married / Language: English / Race: White Female Date of Admission:  10/22/2016 CC:  Right Knee Pain History of Present Illness  The patient is a 66 year old female who comes in for a preoperative History and Physical. The patient is scheduled for a right unicompartmental knee arthroplasty to be performed by Dr. Dione Plover. Aluisio, MD at [facility] on 10-22-2016. The patient is a 66 year old female who presented with bilateral knee complaints. The patient reported right knee was worse than left knee with symptoms including: pain, giving way, weakness, stiffness (heaviness) and popping which began 1 year(s) ago without any known injury. The patient describes their pain as stinging.The patient feels that the symptoms are worsening. The patient has the current diagnosis of knee osteoarthritis. Symptoms are reported to be located in the left medial knee and right medial knee and include knee pain. The patient does not report any radiation of symptoms. Current treatment includes application of ice and nonsteroidal anti-inflammatory drugs (Tramadol given by PCP for back OA). She has had left hip surgery about three years ago which is doing okay. Her knees are giving her problems now. She states that the right knee is greater than the left knee. She is having pain in both knees and they certainly give way a little bit. She states there is a little bit of weakness, stiffness and popping with them. It is difficult to turn over in bed because of the pain. If she sits or rides in a car too long, it is quite difficult to get up out of the car from a standing position. Her first couple of steps are very tough. It eases off once she gets moving, but the pain overall gets worse throughout the day. There is some popping and grinding. Very stiff in the morning. She has not felt any swelling, but they just feel heavy and tight overall. She feels like she has lost some motion with both of  her knees because it is more difficult to get shoes and socks on, also getting up and down the stairs is much more difficult. There has not been any injury with the knees in that she has never had any surgery on it. She tries to stay active and she had to stop the elliptical because of the pain in her knees, but she still tries to walk every morning. She has tried cortisone injections and gel series which have helped for the left knee but the right knee continues to be problematic. The Euflexxa helped her left knee, but not her right knee. She has persistent discomfort in the right knee. It bothers her at all times. It is limiting what she can and cannot do. Pain is isolated to the medial side of her knee. She is not having any instability or swelling. She would like to now proceed with surgery for the right knee at this time. They have been treated conservatively in the past for the above stated problem and despite conservative measures, they continue to have progressive pain and severe functional limitations and dysfunction. They have failed non-operative management including home exercise, medications, and injections. It is felt that they would benefit from undergoing unicompartmental joint replacement. Risks and benefits of the procedure have been discussed with the patient and they elect to proceed with surgery. There are no active contraindications to surgery such as ongoing infection or rapidly progressive neurological disease.  Problem List/Past Medical Hip fracture, intertrochanteric (Z85.885O)  Closed fracture of  distal end of left radius, sequela (S52.502S)  Closed intertrochanteric fracture of left femur, with routine healing, subsequent encounter  Primary osteoarthritis of both knees (M17.0)  Closed fracture of distal end of radius with routine healing, left (S52.502D)  Heart murmur  Hypothyroidism  Diverticulosis  Varicose veins   Allergies  No Known Drug Allergies  Family  History Chronic Obstructive Lung Disease  Father, Sister. Diabetes Mellitus  Sister. Heart Disease  Mother. Osteoarthritis  Father. Osteoporosis  Father. Rheumatoid Arthritis  Paternal Grandmother.  Social History Children  2 Current work status  unemployed Exercise  Exercises weekly; does other Living situation  live with spouse Marital status  married Never consumed alcohol  09/28/2013: Never consumed alcohol No history of drug/alcohol rehab  Not under pain contract  Tobacco / smoke exposure  09/28/2013: no Tobacco use  Never smoker. 09/28/2013  Medication History  TraMADol HCl (50MG  Tablet, 1 (one) Tablet Tablet Oral every 6-8 hours as needed for pain, Taken starting 04/24/2016) Active. Levothyroxine Sodium (100MCG Tablet, Oral) Active.  Past Surgical History  Breast Biopsy  left Hip Fracture and Surgery  left Hysterectomy  partial (non-cancerous)  Review of Systems  General Not Present- Chills, Fatigue, Fever, Memory Loss, Night Sweats, Weight Gain and Weight Loss. Skin Not Present- Eczema, Hives, Itching, Lesions and Rash. HEENT Not Present- Dentures, Double Vision, Headache, Hearing Loss, Tinnitus and Visual Loss. Respiratory Not Present- Allergies, Chronic Cough, Coughing up blood, Shortness of breath at rest and Shortness of breath with exertion. Cardiovascular Not Present- Chest Pain, Difficulty Breathing Lying Down, Murmur, Palpitations, Racing/skipping heartbeats and Swelling. Gastrointestinal Not Present- Abdominal Pain, Bloody Stool, Constipation, Diarrhea, Difficulty Swallowing, Heartburn, Jaundice, Loss of appetitie, Nausea and Vomiting. Female Genitourinary Not Present- Blood in Urine, Discharge, Flank Pain, Incontinence, Painful Urination, Urgency, Urinary frequency, Urinary Retention, Urinating at Night and Weak urinary stream. Musculoskeletal Present- Back Pain, Morning Stiffness and Muscle Pain. Not Present- Joint Pain, Joint Swelling,  Muscle Weakness and Spasms. Neurological Not Present- Blackout spells, Difficulty with balance, Dizziness, Paralysis, Tremor and Weakness. Psychiatric Not Present- Insomnia.  Vitals Weight: 134 lb Height: 62in Weight was reported by patient. Height was reported by patient. Body Surface Area: 1.61 m Body Mass Index: 24.51 kg/m  Pulse: 60 (Regular)  BP: 130/76 (Sitting, Right Arm, Standard)   Physical Exam General Mental Status -Alert, cooperative and good historian. General Appearance-pleasant, Not in acute distress. Orientation-Oriented X3. Build & Nutrition-Well nourished and Well developed.  Head and Neck Head-normocephalic, atraumatic . Neck Global Assessment - supple, no bruit auscultated on the right, no bruit auscultated on the left.  Eye Pupil - Bilateral-Regular and Round. Motion - Bilateral-EOMI.  Chest and Lung Exam Auscultation Breath sounds - clear at anterior chest wall and clear at posterior chest wall. Adventitious sounds - No Adventitious sounds.  Cardiovascular Auscultation Rhythm - Regular rate and rhythm. Heart Sounds - S1 WNL and S2 WNL. Murmurs & Other Heart Sounds - Auscultation of the heart reveals - No Murmurs.  Abdomen Palpation/Percussion Tenderness - Abdomen is non-tender to palpation. Rigidity (guarding) - Abdomen is soft. Auscultation Auscultation of the abdomen reveals - Bowel sounds normal.  Female Genitourinary Note: Not done, not pertinent to present illness   Musculoskeletal Note: She is in no distress. Her right knee shows no effusion. Range about 0 to 130. She is very tender in the medial aspect of the knee. There is no lateral joint space tenderness or any instability noted.  Radiographs are reviewed AP both knees and lateral and she does  have very significant medial joint space narrowing on that right knee. There is no tibial subluxation. Lateral and patellofemoral compartments looked  uninvolved.   Assessment & Plan Primary osteoarthritis of both knees (M17.0)  Note:Surgical Plans: Right Unicompartmental Knee  Disposition: Home and straight to outpatient therapy at [facility] in [place], [place]. RX was sent to [facility] for therapy.  PCP: Dr. Maudie Mercury  IV TXA  Anesthesia Issues: None  Patient was instructed on what medications to stop prior to surgery.  Signed electronically by Joelene Millin, III PA-C

## 2016-10-22 ENCOUNTER — Ambulatory Visit (HOSPITAL_COMMUNITY): Admitting: Certified Registered Nurse Anesthetist

## 2016-10-22 ENCOUNTER — Observation Stay (HOSPITAL_COMMUNITY)
Admission: RE | Admit: 2016-10-22 | Discharge: 2016-10-23 | Disposition: A | Discharge status: Home / Self Care | Source: Ambulatory Visit | Attending: Orthopedic Surgery | Admitting: Orthopedic Surgery

## 2016-10-22 ENCOUNTER — Encounter (HOSPITAL_COMMUNITY): Payer: Self-pay | Admitting: Certified Registered Nurse Anesthetist

## 2016-10-22 ENCOUNTER — Encounter (HOSPITAL_COMMUNITY)
Admission: RE | Disposition: A | Discharge status: Home / Self Care | Payer: Self-pay | Source: Ambulatory Visit | Attending: Orthopedic Surgery

## 2016-10-22 DIAGNOSIS — R011 Cardiac murmur, unspecified: Secondary | ICD-10-CM | POA: Insufficient documentation

## 2016-10-22 DIAGNOSIS — Z90710 Acquired absence of both cervix and uterus: Secondary | ICD-10-CM | POA: Insufficient documentation

## 2016-10-22 DIAGNOSIS — Z833 Family history of diabetes mellitus: Secondary | ICD-10-CM | POA: Diagnosis not present

## 2016-10-22 DIAGNOSIS — Z803 Family history of malignant neoplasm of breast: Secondary | ICD-10-CM | POA: Insufficient documentation

## 2016-10-22 DIAGNOSIS — M170 Bilateral primary osteoarthritis of knee: Secondary | ICD-10-CM | POA: Diagnosis not present

## 2016-10-22 DIAGNOSIS — Z8249 Family history of ischemic heart disease and other diseases of the circulatory system: Secondary | ICD-10-CM | POA: Diagnosis not present

## 2016-10-22 DIAGNOSIS — M479 Spondylosis, unspecified: Secondary | ICD-10-CM | POA: Insufficient documentation

## 2016-10-22 DIAGNOSIS — M179 Osteoarthritis of knee, unspecified: Secondary | ICD-10-CM | POA: Diagnosis present

## 2016-10-22 DIAGNOSIS — I8390 Asymptomatic varicose veins of unspecified lower extremity: Secondary | ICD-10-CM | POA: Diagnosis not present

## 2016-10-22 DIAGNOSIS — M1711 Unilateral primary osteoarthritis, right knee: Secondary | ICD-10-CM | POA: Diagnosis present

## 2016-10-22 DIAGNOSIS — Z825 Family history of asthma and other chronic lower respiratory diseases: Secondary | ICD-10-CM | POA: Diagnosis not present

## 2016-10-22 DIAGNOSIS — Z8262 Family history of osteoporosis: Secondary | ICD-10-CM | POA: Insufficient documentation

## 2016-10-22 DIAGNOSIS — Z79899 Other long term (current) drug therapy: Secondary | ICD-10-CM | POA: Insufficient documentation

## 2016-10-22 DIAGNOSIS — K5790 Diverticulosis of intestine, part unspecified, without perforation or abscess without bleeding: Secondary | ICD-10-CM | POA: Insufficient documentation

## 2016-10-22 DIAGNOSIS — M1710 Unilateral primary osteoarthritis, unspecified knee: Secondary | ICD-10-CM | POA: Diagnosis present

## 2016-10-22 DIAGNOSIS — Z8261 Family history of arthritis: Secondary | ICD-10-CM | POA: Insufficient documentation

## 2016-10-22 DIAGNOSIS — E039 Hypothyroidism, unspecified: Secondary | ICD-10-CM | POA: Diagnosis not present

## 2016-10-22 HISTORY — PX: PARTIAL KNEE ARTHROPLASTY: SHX2174

## 2016-10-22 LAB — TYPE AND SCREEN
ABO/RH(D): O POS
Antibody Screen: NEGATIVE

## 2016-10-22 SURGERY — ARTHROPLASTY, KNEE, UNICOMPARTMENTAL
Anesthesia: Choice | Site: Knee | Laterality: Right

## 2016-10-22 MED ORDER — OXYCODONE HCL 5 MG PO TABS
5.0000 mg | ORAL_TABLET | ORAL | Status: DC | PRN
  Administered 2016-10-22: 5 mg via ORAL
  Administered 2016-10-22 (×2): 10 mg via ORAL
  Administered 2016-10-22: 5 mg via ORAL
  Administered 2016-10-23 (×3): 10 mg via ORAL
  Filled 2016-10-22: qty 1
  Filled 2016-10-22 (×2): qty 2
  Filled 2016-10-22: qty 1
  Filled 2016-10-22 (×3): qty 2

## 2016-10-22 MED ORDER — MORPHINE SULFATE (PF) 2 MG/ML IV SOLN
1.0000 mg | INTRAVENOUS | Status: DC | PRN
  Administered 2016-10-23 (×2): 1 mg via INTRAVENOUS
  Filled 2016-10-22 (×2): qty 1

## 2016-10-22 MED ORDER — OXYCODONE HCL 5 MG PO TABS: 5.0000 mg | ORAL_TABLET | Freq: Once | ORAL | Status: DC | PRN

## 2016-10-22 MED ORDER — LIDOCAINE 2% (20 MG/ML) 5 ML SYRINGE
INTRAMUSCULAR | Status: DC | PRN
  Administered 2016-10-22: 60 mg via INTRAVENOUS

## 2016-10-22 MED ORDER — DEXAMETHASONE SODIUM PHOSPHATE 10 MG/ML IJ SOLN
10.0000 mg | Freq: Once | INTRAMUSCULAR | Status: AC
  Administered 2016-10-23: 10 mg via INTRAVENOUS
  Filled 2016-10-22: qty 1

## 2016-10-22 MED ORDER — DIPHENHYDRAMINE HCL 12.5 MG/5ML PO ELIX
12.5000 mg | ORAL_SOLUTION | ORAL | Status: DC | PRN
Start: 2016-10-22 — End: 2016-10-23

## 2016-10-22 MED ORDER — OXYCODONE HCL 5 MG/5ML PO SOLN
5.0000 mg | Freq: Once | ORAL | Status: DC | PRN
  Filled 2016-10-22: qty 5

## 2016-10-22 MED ORDER — PHENOL 1.4 % MT LIQD
1.0000 | OROMUCOSAL | Status: DC | PRN
Start: 2016-10-22 — End: 2016-10-23

## 2016-10-22 MED ORDER — MENTHOL 3 MG MT LOZG: 1.0000 | LOZENGE | OROMUCOSAL | Status: DC | PRN

## 2016-10-22 MED ORDER — DOCUSATE SODIUM 100 MG PO CAPS
100.0000 mg | ORAL_CAPSULE | Freq: Two times a day (BID) | ORAL | Status: DC
  Administered 2016-10-22 – 2016-10-23 (×2): 100 mg via ORAL
  Filled 2016-10-22 (×2): qty 1

## 2016-10-22 MED ORDER — ACETAMINOPHEN 500 MG PO TABS
1000.0000 mg | ORAL_TABLET | Freq: Four times a day (QID) | ORAL | Status: AC
  Administered 2016-10-22 – 2016-10-23 (×4): 1000 mg via ORAL
  Filled 2016-10-22 (×4): qty 2

## 2016-10-22 MED ORDER — CEFAZOLIN SODIUM-DEXTROSE 2-4 GM/100ML-% IV SOLN
INTRAVENOUS | Status: AC
  Filled 2016-10-22: qty 100

## 2016-10-22 MED ORDER — ONDANSETRON HCL 4 MG/2ML IJ SOLN
INTRAMUSCULAR | Status: AC
  Filled 2016-10-22: qty 2

## 2016-10-22 MED ORDER — BISACODYL 10 MG RE SUPP: 10.0000 mg | Freq: Every day | RECTAL | Status: DC | PRN

## 2016-10-22 MED ORDER — TRAMADOL HCL 50 MG PO TABS
50.0000 mg | ORAL_TABLET | Freq: Four times a day (QID) | ORAL | Status: DC | PRN
  Administered 2016-10-23: 100 mg via ORAL
  Filled 2016-10-22: qty 2

## 2016-10-22 MED ORDER — FENTANYL CITRATE (PF) 100 MCG/2ML IJ SOLN
INTRAMUSCULAR | Status: AC
  Filled 2016-10-22: qty 4

## 2016-10-22 MED ORDER — SUGAMMADEX SODIUM 200 MG/2ML IV SOLN
INTRAVENOUS | Status: AC
  Filled 2016-10-22: qty 2

## 2016-10-22 MED ORDER — POLYETHYLENE GLYCOL 3350 17 G PO PACK
17.0000 g | PACK | Freq: Every day | ORAL | Status: DC | PRN
Start: 2016-10-22 — End: 2016-10-23

## 2016-10-22 MED ORDER — METHOCARBAMOL 1000 MG/10ML IJ SOLN
500.0000 mg | Freq: Four times a day (QID) | INTRAVENOUS | Status: DC | PRN
  Administered 2016-10-22: 500 mg via INTRAVENOUS
  Filled 2016-10-22: qty 550

## 2016-10-22 MED ORDER — FENTANYL CITRATE (PF) 100 MCG/2ML IJ SOLN
INTRAMUSCULAR | Status: DC | PRN
  Administered 2016-10-22 (×6): 50 ug via INTRAVENOUS

## 2016-10-22 MED ORDER — PROPOFOL 10 MG/ML IV BOLUS
INTRAVENOUS | Status: AC
  Filled 2016-10-22: qty 60

## 2016-10-22 MED ORDER — SODIUM CHLORIDE 0.9 % IJ SOLN
INTRAMUSCULAR | Status: AC
  Filled 2016-10-22: qty 50

## 2016-10-22 MED ORDER — FLEET ENEMA 7-19 GM/118ML RE ENEM: 1.0000 | ENEMA | Freq: Once | RECTAL | Status: DC | PRN

## 2016-10-22 MED ORDER — SUGAMMADEX SODIUM 200 MG/2ML IV SOLN
INTRAVENOUS | Status: DC | PRN
  Administered 2016-10-22: 120 mg via INTRAVENOUS

## 2016-10-22 MED ORDER — SODIUM CHLORIDE 0.9 % IJ SOLN
INTRAMUSCULAR | Status: DC | PRN
  Administered 2016-10-22: 30 mL

## 2016-10-22 MED ORDER — ONDANSETRON HCL 4 MG/2ML IJ SOLN: 4.0000 mg | Freq: Four times a day (QID) | INTRAMUSCULAR | Status: DC | PRN

## 2016-10-22 MED ORDER — ACETAMINOPHEN 10 MG/ML IV SOLN
INTRAVENOUS | Status: AC
  Filled 2016-10-22: qty 100

## 2016-10-22 MED ORDER — FENTANYL CITRATE (PF) 100 MCG/2ML IJ SOLN
INTRAMUSCULAR | Status: AC
  Filled 2016-10-22: qty 2

## 2016-10-22 MED ORDER — PROPOFOL 10 MG/ML IV BOLUS
INTRAVENOUS | Status: DC | PRN
Start: 2016-10-22 — End: 2016-10-22
  Administered 2016-10-22: 130 mg via INTRAVENOUS

## 2016-10-22 MED ORDER — METHOCARBAMOL 500 MG PO TABS
500.0000 mg | ORAL_TABLET | Freq: Four times a day (QID) | ORAL | Status: DC | PRN
  Administered 2016-10-22 – 2016-10-23 (×2): 500 mg via ORAL
  Filled 2016-10-22 (×2): qty 1

## 2016-10-22 MED ORDER — SODIUM CHLORIDE 0.9 % IR SOLN
Status: DC | PRN
  Administered 2016-10-22: 1000 mL

## 2016-10-22 MED ORDER — CEFAZOLIN SODIUM-DEXTROSE 2-4 GM/100ML-% IV SOLN
2.0000 g | INTRAVENOUS | Status: AC
  Administered 2016-10-22: 2 g via INTRAVENOUS

## 2016-10-22 MED ORDER — METOCLOPRAMIDE HCL 5 MG/ML IJ SOLN: 5.0000 mg | Freq: Three times a day (TID) | INTRAMUSCULAR | Status: DC | PRN

## 2016-10-22 MED ORDER — FENTANYL CITRATE (PF) 100 MCG/2ML IJ SOLN
25.0000 ug | INTRAMUSCULAR | Status: DC | PRN
  Administered 2016-10-22 (×3): 50 ug via INTRAVENOUS

## 2016-10-22 MED ORDER — ROCURONIUM BROMIDE 50 MG/5ML IV SOSY
PREFILLED_SYRINGE | INTRAVENOUS | Status: AC
  Filled 2016-10-22: qty 5

## 2016-10-22 MED ORDER — ONDANSETRON HCL 4 MG PO TABS: 4.0000 mg | ORAL_TABLET | Freq: Four times a day (QID) | ORAL | Status: DC | PRN

## 2016-10-22 MED ORDER — ACETAMINOPHEN 10 MG/ML IV SOLN
1000.0000 mg | Freq: Once | INTRAVENOUS | Status: AC
  Administered 2016-10-22: 1000 mg via INTRAVENOUS

## 2016-10-22 MED ORDER — MIDAZOLAM HCL 2 MG/2ML IJ SOLN
INTRAMUSCULAR | Status: AC
  Filled 2016-10-22: qty 2

## 2016-10-22 MED ORDER — LEVOTHYROXINE SODIUM 100 MCG PO TABS
100.0000 ug | ORAL_TABLET | Freq: Every day | ORAL | Status: DC
  Administered 2016-10-23: 100 ug via ORAL
  Filled 2016-10-22: qty 1

## 2016-10-22 MED ORDER — METOCLOPRAMIDE HCL 5 MG PO TABS: 5.0000 mg | ORAL_TABLET | Freq: Three times a day (TID) | ORAL | Status: DC | PRN

## 2016-10-22 MED ORDER — LIDOCAINE 2% (20 MG/ML) 5 ML SYRINGE
INTRAMUSCULAR | Status: AC
  Filled 2016-10-22: qty 5

## 2016-10-22 MED ORDER — BUPIVACAINE LIPOSOME 1.3 % IJ SUSP
20.0000 mL | Freq: Once | INTRAMUSCULAR | Status: DC
  Filled 2016-10-22: qty 20

## 2016-10-22 MED ORDER — SODIUM CHLORIDE 0.9 % IV SOLN
INTRAVENOUS | Status: DC
  Administered 2016-10-22: 13:00:00 via INTRAVENOUS

## 2016-10-22 MED ORDER — LACTATED RINGERS IV SOLN
INTRAVENOUS | Status: DC
  Administered 2016-10-22 (×2): via INTRAVENOUS

## 2016-10-22 MED ORDER — ROCURONIUM BROMIDE 50 MG/5ML IV SOSY
PREFILLED_SYRINGE | INTRAVENOUS | Status: DC | PRN
  Administered 2016-10-22: 40 mg via INTRAVENOUS

## 2016-10-22 MED ORDER — TRANEXAMIC ACID 1000 MG/10ML IV SOLN
1000.0000 mg | INTRAVENOUS | Status: AC
  Administered 2016-10-22: 1000 mg via INTRAVENOUS
  Filled 2016-10-22: qty 1100

## 2016-10-22 MED ORDER — 0.9 % SODIUM CHLORIDE (POUR BTL) OPTIME
TOPICAL | Status: DC | PRN
  Administered 2016-10-22: 1000 mL

## 2016-10-22 MED ORDER — MIDAZOLAM HCL 5 MG/5ML IJ SOLN
INTRAMUSCULAR | Status: DC | PRN
  Administered 2016-10-22 (×2): 1 mg via INTRAVENOUS

## 2016-10-22 MED ORDER — FENTANYL CITRATE (PF) 100 MCG/2ML IJ SOLN
INTRAMUSCULAR | Status: AC
  Administered 2016-10-22: 50 ug via INTRAVENOUS
  Filled 2016-10-22: qty 2

## 2016-10-22 MED ORDER — DEXAMETHASONE SODIUM PHOSPHATE 10 MG/ML IJ SOLN
INTRAMUSCULAR | Status: AC
  Filled 2016-10-22: qty 1

## 2016-10-22 MED ORDER — CHLORHEXIDINE GLUCONATE 4 % EX LIQD: 60.0000 mL | Freq: Once | CUTANEOUS | Status: DC

## 2016-10-22 MED ORDER — BUPIVACAINE LIPOSOME 1.3 % IJ SUSP
INTRAMUSCULAR | Status: DC | PRN
  Administered 2016-10-22: 20 mL

## 2016-10-22 MED ORDER — ONDANSETRON HCL 4 MG/2ML IJ SOLN
INTRAMUSCULAR | Status: DC | PRN
  Administered 2016-10-22: 4 mg via INTRAVENOUS

## 2016-10-22 MED ORDER — DEXAMETHASONE SODIUM PHOSPHATE 10 MG/ML IJ SOLN
10.0000 mg | Freq: Once | INTRAMUSCULAR | Status: AC
  Administered 2016-10-22: 10 mg via INTRAVENOUS

## 2016-10-22 MED ORDER — CEFAZOLIN SODIUM-DEXTROSE 2-4 GM/100ML-% IV SOLN
2.0000 g | Freq: Four times a day (QID) | INTRAVENOUS | Status: AC
  Administered 2016-10-22 – 2016-10-23 (×2): 2 g via INTRAVENOUS
  Filled 2016-10-22 (×2): qty 100

## 2016-10-22 MED ORDER — BUPIVACAINE HCL (PF) 0.25 % IJ SOLN
INTRAMUSCULAR | Status: AC
  Filled 2016-10-22: qty 30

## 2016-10-22 MED ORDER — ONDANSETRON HCL 4 MG/2ML IJ SOLN: 4.0000 mg | Freq: Once | INTRAMUSCULAR | Status: DC | PRN

## 2016-10-22 MED ORDER — RIVAROXABAN 10 MG PO TABS
10.0000 mg | ORAL_TABLET | Freq: Every day | ORAL | Status: DC
  Administered 2016-10-23: 10 mg via ORAL
  Filled 2016-10-22: qty 1

## 2016-10-22 SURGICAL SUPPLY — 45 items
BAG DECANTER FOR FLEXI CONT (MISCELLANEOUS) ×2 IMPLANT
BAG ZIPLOCK 12X15 (MISCELLANEOUS) IMPLANT
BANDAGE ACE 6X5 VEL STRL LF (GAUZE/BANDAGES/DRESSINGS) ×2 IMPLANT
BLADE SAW RECIPROCATING 77.5 (BLADE) ×2 IMPLANT
BLADE SAW SGTL 13.0X1.19X90.0M (BLADE) ×2 IMPLANT
BLADE SAW SGTL 81X20 HD (BLADE) ×2 IMPLANT
BNDG CONFORM 6X.82 1P STRL (GAUZE/BANDAGES/DRESSINGS) ×2 IMPLANT
BOWL SMART MIX CTS (DISPOSABLE) ×2 IMPLANT
BUR OVAL CARBIDE 4.0 (BURR) ×2 IMPLANT
CAPT KNEE PARTIAL 2 ×1 IMPLANT
CEMENT HV SMART SET (Cement) ×2 IMPLANT
CLOTH BEACON ORANGE TIMEOUT ST (SAFETY) ×2 IMPLANT
COVER SURGICAL LIGHT HANDLE (MISCELLANEOUS) ×2 IMPLANT
CUFF TOURN SGL QUICK 34 (TOURNIQUET CUFF) ×1
CUFF TRNQT CYL 34X4X40X1 (TOURNIQUET CUFF) ×1 IMPLANT
DRSG ADAPTIC 3X8 NADH LF (GAUZE/BANDAGES/DRESSINGS) ×2 IMPLANT
DRSG PAD ABDOMINAL 8X10 ST (GAUZE/BANDAGES/DRESSINGS) ×2 IMPLANT
DURAPREP 26ML APPLICATOR (WOUND CARE) ×2 IMPLANT
ELECT REM PT RETURN 15FT ADLT (MISCELLANEOUS) ×2 IMPLANT
EVACUATOR 1/8 PVC DRAIN (DRAIN) ×2 IMPLANT
GAUZE SPONGE 4X4 12PLY STRL (GAUZE/BANDAGES/DRESSINGS) ×2 IMPLANT
GLOVE BIO SURGEON STRL SZ7.5 (GLOVE) ×2 IMPLANT
GLOVE BIO SURGEON STRL SZ8 (GLOVE) ×2 IMPLANT
GLOVE BIOGEL PI IND STRL 8 (GLOVE) ×2 IMPLANT
GLOVE BIOGEL PI INDICATOR 8 (GLOVE) ×2
GOWN STRL REUS W/TWL LRG LVL3 (GOWN DISPOSABLE) ×2 IMPLANT
GOWN STRL REUS W/TWL XL LVL3 (GOWN DISPOSABLE) ×2 IMPLANT
HANDPIECE INTERPULSE COAX TIP (DISPOSABLE) ×1
IMMOBILIZER KNEE 20 (SOFTGOODS) ×2
IMMOBILIZER KNEE 20 THIGH 36 (SOFTGOODS) ×1 IMPLANT
KIT IMPL STRL TIB IPOLY IUNI ×1 IMPLANT
MANIFOLD NEPTUNE II (INSTRUMENTS) ×2 IMPLANT
PACK TOTAL KNEE CUSTOM (KITS) ×2 IMPLANT
PAD ABD 8X10 STRL (GAUZE/BANDAGES/DRESSINGS) ×2 IMPLANT
PADDING CAST COTTON 6X4 STRL (CAST SUPPLIES) ×4 IMPLANT
POSITIONER SURGICAL ARM (MISCELLANEOUS) ×2 IMPLANT
SET HNDPC FAN SPRY TIP SCT (DISPOSABLE) ×1 IMPLANT
STRIP CLOSURE SKIN 1/2X4 (GAUZE/BANDAGES/DRESSINGS) ×4 IMPLANT
SUT MNCRL AB 4-0 PS2 18 (SUTURE) ×2 IMPLANT
SUT VIC AB 2-0 CT1 27 (SUTURE) ×2
SUT VIC AB 2-0 CT1 TAPERPNT 27 (SUTURE) ×2 IMPLANT
SUT VLOC 180 0 24IN GS25 (SUTURE) ×2 IMPLANT
SYR 50ML LL SCALE MARK (SYRINGE) ×2 IMPLANT
TAPE STRIPS DRAPE STRL (GAUZE/BANDAGES/DRESSINGS) ×2 IMPLANT
WRAP KNEE MAXI GEL POST OP (GAUZE/BANDAGES/DRESSINGS) ×2 IMPLANT

## 2016-10-22 NOTE — Anesthesia Postprocedure Evaluation (Addendum)
Anesthesia Post Note  Patient: [facility]  Procedure(s) Performed: Procedure(s) (LRB): RIGHT UNICOMPARTMENTAL KNEE (Right)  Patient location during evaluation: PACU Anesthesia Type: General Level of consciousness: awake, awake and alert and oriented Pain management: pain level controlled Vital Signs Assessment: vitals unstable and post-procedure vital signs reviewed and stable Respiratory status: spontaneous breathing, nonlabored ventilation and respiratory function stable Cardiovascular status: blood pressure returned to baseline Anesthetic complications: no       Last Vitals:  Vitals:   10/22/16 1521 10/22/16 1742  BP: (!) 96/40 (!) 98/43  Pulse: 62 69  Resp: 14 14  Temp: 36.6 C 36.5 C    Last Pain:  Vitals:   10/22/16 1748  TempSrc:   PainSc: 6                  Denver Harder COKER

## 2016-10-22 NOTE — Transfer of Care (Signed)
Immediate Anesthesia Transfer of Care Note  Patient: Joanne [facility]n  Procedure(s) Performed: Procedure(s): RIGHT UNICOMPARTMENTAL KNEE (Right)  Patient Location: PACU  Anesthesia Type:General and GA combined with regional for post-op pain  Level of Consciousness: awake, alert  and oriented  Airway & Oxygen Therapy: Patient Spontanous Breathing and Patient connected to face mask oxygen  Post-op Assessment: Report given to RN and Post -op Vital signs reviewed and stable  Post vital signs: Reviewed and stable  Last Vitals:  Vitals:   10/22/16 0942 10/22/16 0944  BP:    Pulse: 67 64  Resp: (!) 8 13  Temp:      Last Pain:  Vitals:   10/22/16 0850  TempSrc:   PainSc: 4          Complications: No apparent anesthesia complications

## 2016-10-22 NOTE — Op Note (Signed)
OPERATIVE REPORT-UNICOMPARTMENTAL ARTHROPLASTY  PREOPERATIVE DIAGNOSIS: Medial compartment osteoarthritis, Right knee  POSTOPERATIVE DIAGNOSIS: Medial compartment osteoarthritis, Right knee  PROCEDURE:Right knee medial unicompartmental arthroplasty.   SURGEON: Gaynelle Arabian, MD   ASSISTANT: Nurse assist  ANESTHESIA:  GA combined with regional for post-op pain.   ESTIMATED BLOOD LOSS: Minimal.   DRAINS: Hemovac x1.   TOURNIQUET TIME:   Total Tourniquet Time Documented: Thigh (Right) - 31 minutes Total: Thigh (Right) - 31 minutes    COMPLICATIONS: None.   CONDITION: Stable to recovery.   BRIEF CLINICAL NOTE:Joanne Vaughn is a 66 y.o. female, who has  significant isolated medial compartment arthritis of the Right knee. The patient has had nonoperative management including injections of cortisone and viscous supplements. Unfortunately, the pain persists.  Radiograph showed isolated medial compartment bone-on-bone arthritis  with normal-appearing patellofemoral and lateral compartments. The patient presents now for left knee unicompartmental arthroplasty.   PROCEDURE IN DETAIL: After successful administration of  GA combined with regional for post-op pain anesthetic, a tourniquet was placed high on the  Right thigh and the Right lower extremity prepped and draped in usual sterile fashion. Extremity was wrapped in an Esmarch, knee flexed, and tourniquet inflated to 300 mmHg.       A midline incision was made with a 10 blade through subcutaneous  tissue to the extensor mechanism. A fresh blade was used to make a  medial parapatellar arthrotomy. Soft tissue on the proximal medial  tibia subperiosteally elevated to the joint line with a knife and into  the semimembranosus bursa with a Cobb elevator. The patella was  subluxed laterally, and the knee flexed 90 degrees. The ACL was intact.  The marginal osteophytes on the medial femur and tibia were removed with  a  rongeur. The medial meniscus was also removed. The femoral cutting  block for the conformis unicompartmental knee system was placed along  the femur. There was excellent fit. I traced the outline. We then  removed any remaining cartilage within this outline. We then placed the  cutting block again and pinned in position. The posterior femoral cut  was made, it was approximately 5 mm. The lug holes for the femoral  component were then drilled through the cutting block. The cutting  block was subsequently removed. We then utilized the high speed burr to  create a small trough at the superior aspect of the component tomake it inset and would not overhang the cartilage. The trial was placed,  it had excellent fit. The trial was subsequently removed.       The trial was placed again and the B chip was placed. There was  excellent balance throughout full motion. Also with excellent fit on  the tibia. This was removed as was the femoral trial. A curette was  used to remove any remaining cartilage from the tibia. The tibial  cutting block was then placed and there was a perfect fit on the tibial  surface. The appropriate slope was placed and it was pinned in  position. The reciprocating saw was used to make the central cut and  then the oscillating saw used to make the horizontal cut. The bone  fragment was then removed. The tibial trial was placed and had perfect  fit on the tibia. We then drilled the 2 lug holes and did the keel punch.  We then placed tibia trial and femur trial, and a 6 mm trial insert. There was  excellent stability throughout full range of motion and no impingement.  The trial was then removed. We drilled small holes in the distal  femur in order to create more conduits for the cement. The cut bone  surfaces were thoroughly irrigated with pulsatile lavage while the  cement was mixed on the back table. We then cemented the tibial  component into place, impacted it and removed  the extruded cement. The  same was done for the femoral component. Trial 6-mm inserts placed,  knee held in full extension, and all extruded cement removed. While the  cement was hardening, I injected the extensor mechanism, periosteum of  the femur and subcu tissues, a total of 20 mL of Exparel mixed with 30  mL of saline and then did an additional injection of 20 mL of 0.25%  Marcaine into the same tissues. When the cement had fully hardened,  then the permanent polyethylene was placed in tibial tray. There was  excellent stability throughout full range of motion with no lift off the  component and no evidence of any impingement.       Wound was copiously irrigated with saline solution, and the arthrotomy closed over a Hemovac drain with a running #1 V-Loc suture. The subcutaneous was closed with  interrupted 2-0 Vicryl and subcuticular running 4-0 Monocryl. The drain  was hooked to suction. Incision cleaned and dried and Steri-Strips and  a bulky sterile dressing applied. The tourniquet was released after a  total time of 31 minutes. This was done after closing the extensor  mechanism. The wound was closed and a bulky sterile dressing was  applied. The operative limb was placed into a knee immobilizer, and the patient awakened and transported to recovery room in stable condition.       Please note that a surgical assistant was a medical necessity for this  procedure in order to perform it in a safe and expeditious manner.  Assistance was necessary for retracting vital ligaments, neurovascular  structures, as well as for proper positioning of the limb to allow for  appropriate bone cuts and appropriate placement of the prosthesis.    Dione Plover Wassim Kirksey, MD

## 2016-10-22 NOTE — Progress Notes (Signed)
AssistedDr. Joslin with right, ultrasound guided, adductor canal block. Side rails up, monitors on throughout procedure. See vital signs in flow sheet. Tolerated Procedure well.  

## 2016-10-22 NOTE — H&P (View-Only) (Signed)
Joanne Vaughn DOB: 03-24-1951 Married / Language: English / Race: White Female Date of Admission:  10/22/2016 CC:  Right Knee Pain History of Present Illness  The patient is a 66 year old female who comes in for a preoperative History and Physical. The patient is scheduled for a right unicompartmental knee arthroplasty to be performed by Dr. Dione Plover. Aluisio, MD at [facility] on 10-22-2016. The patient is a 66 year old female who presented with bilateral knee complaints. The patient reported right knee was worse than left knee with symptoms including: pain, giving way, weakness, stiffness (heaviness) and popping which began 1 year(s) ago without any known injury. The patient describes their pain as stinging.The patient feels that the symptoms are worsening. The patient has the current diagnosis of knee osteoarthritis. Symptoms are reported to be located in the left medial knee and right medial knee and include knee pain. The patient does not report any radiation of symptoms. Current treatment includes application of ice and nonsteroidal anti-inflammatory drugs (Tramadol given by PCP for back OA). She has had left hip surgery about three years ago which is doing okay. Her knees are giving her problems now. She states that the right knee is greater than the left knee. She is having pain in both knees and they certainly give way a little bit. She states there is a little bit of weakness, stiffness and popping with them. It is difficult to turn over in bed because of the pain. If she sits or rides in a car too long, it is quite difficult to get up out of the car from a standing position. Her first couple of steps are very tough. It eases off once she gets moving, but the pain overall gets worse throughout the day. There is some popping and grinding. Very stiff in the morning. She has not felt any swelling, but they just feel heavy and tight overall. She feels like she has lost some motion with both of  her knees because it is more difficult to get shoes and socks on, also getting up and down the stairs is much more difficult. There has not been any injury with the knees in that she has never had any surgery on it. She tries to stay active and she had to stop the elliptical because of the pain in her knees, but she still tries to walk every morning. She has tried cortisone injections and gel series which have helped for the left knee but the right knee continues to be problematic. The Euflexxa helped her left knee, but not her right knee. She has persistent discomfort in the right knee. It bothers her at all times. It is limiting what she can and cannot do. Pain is isolated to the medial side of her knee. She is not having any instability or swelling. She would like to now proceed with surgery for the right knee at this time. They have been treated conservatively in the past for the above stated problem and despite conservative measures, they continue to have progressive pain and severe functional limitations and dysfunction. They have failed non-operative management including home exercise, medications, and injections. It is felt that they would benefit from undergoing unicompartmental joint replacement. Risks and benefits of the procedure have been discussed with the patient and they elect to proceed with surgery. There are no active contraindications to surgery such as ongoing infection or rapidly progressive neurological disease.  Problem List/Past Medical Hip fracture, intertrochanteric (L89.211H)  Closed fracture of  distal end of left radius, sequela (S52.502S)  Closed intertrochanteric fracture of left femur, with routine healing, subsequent encounter  Primary osteoarthritis of both knees (M17.0)  Closed fracture of distal end of radius with routine healing, left (S52.502D)  Heart murmur  Hypothyroidism  Diverticulosis  Varicose veins   Allergies  No Known Drug Allergies  Family  History Chronic Obstructive Lung Disease  Father, Sister. Diabetes Mellitus  Sister. Heart Disease  Mother. Osteoarthritis  Father. Osteoporosis  Father. Rheumatoid Arthritis  Paternal Grandmother.  Social History Children  2 Current work status  unemployed Exercise  Exercises weekly; does other Living situation  live with spouse Marital status  married Never consumed alcohol  09/28/2013: Never consumed alcohol No history of drug/alcohol rehab  Not under pain contract  Tobacco / smoke exposure  09/28/2013: no Tobacco use  Never smoker. 09/28/2013  Medication History  TraMADol HCl (50MG  Tablet, 1 (one) Tablet Tablet Oral every 6-8 hours as needed for pain, Taken starting 04/24/2016) Active. Levothyroxine Sodium (100MCG Tablet, Oral) Active.  Past Surgical History  Breast Biopsy  left Hip Fracture and Surgery  left Hysterectomy  partial (non-cancerous)  Review of Systems  General Not Present- Chills, Fatigue, Fever, Memory Loss, Night Sweats, Weight Gain and Weight Loss. Skin Not Present- Eczema, Hives, Itching, Lesions and Rash. HEENT Not Present- Dentures, Double Vision, Headache, Hearing Loss, Tinnitus and Visual Loss. Respiratory Not Present- Allergies, Chronic Cough, Coughing up blood, Shortness of breath at rest and Shortness of breath with exertion. Cardiovascular Not Present- Chest Pain, Difficulty Breathing Lying Down, Murmur, Palpitations, Racing/skipping heartbeats and Swelling. Gastrointestinal Not Present- Abdominal Pain, Bloody Stool, Constipation, Diarrhea, Difficulty Swallowing, Heartburn, Jaundice, Loss of appetitie, Nausea and Vomiting. Female Genitourinary Not Present- Blood in Urine, Discharge, Flank Pain, Incontinence, Painful Urination, Urgency, Urinary frequency, Urinary Retention, Urinating at Night and Weak urinary stream. Musculoskeletal Present- Back Pain, Morning Stiffness and Muscle Pain. Not Present- Joint Pain, Joint Swelling,  Muscle Weakness and Spasms. Neurological Not Present- Blackout spells, Difficulty with balance, Dizziness, Paralysis, Tremor and Weakness. Psychiatric Not Present- Insomnia.  Vitals Weight: 134 lb Height: 62in Weight was reported by patient. Height was reported by patient. Body Surface Area: 1.61 m Body Mass Index: 24.51 kg/m  Pulse: 60 (Regular)  BP: 130/76 (Sitting, Right Arm, Standard)   Physical Exam General Mental Status -Alert, cooperative and good historian. General Appearance-pleasant, Not in acute distress. Orientation-Oriented X3. Build & Nutrition-Well nourished and Well developed.  Head and Neck Head-normocephalic, atraumatic . Neck Global Assessment - supple, no bruit auscultated on the right, no bruit auscultated on the left.  Eye Pupil - Bilateral-Regular and Round. Motion - Bilateral-EOMI.  Chest and Lung Exam Auscultation Breath sounds - clear at anterior chest wall and clear at posterior chest wall. Adventitious sounds - No Adventitious sounds.  Cardiovascular Auscultation Rhythm - Regular rate and rhythm. Heart Sounds - S1 WNL and S2 WNL. Murmurs & Other Heart Sounds - Auscultation of the heart reveals - No Murmurs.  Abdomen Palpation/Percussion Tenderness - Abdomen is non-tender to palpation. Rigidity (guarding) - Abdomen is soft. Auscultation Auscultation of the abdomen reveals - Bowel sounds normal.  Female Genitourinary Note: Not done, not pertinent to present illness   Musculoskeletal Note: She is in no distress. Her right knee shows no effusion. Range about 0 to 130. She is very tender in the medial aspect of the knee. There is no lateral joint space tenderness or any instability noted.  Radiographs are reviewed AP both knees and lateral and she does  have very significant medial joint space narrowing on that right knee. There is no tibial subluxation. Lateral and patellofemoral compartments looked  uninvolved.   Assessment & Plan Primary osteoarthritis of both knees (M17.0)  Note:Surgical Plans: Right Unicompartmental Knee  Disposition: Home and straight to outpatient therapy at [facility] in [place], [place]. RX was sent to [facility] for therapy.  PCP: Dr. Maudie Mercury  IV TXA  Anesthesia Issues: None  Patient was instructed on what medications to stop prior to surgery.  Signed electronically by Joelene Millin, III PA-C

## 2016-10-22 NOTE — Anesthesia Procedure Notes (Signed)
Anesthesia Regional Block: Adductor canal block   Pre-Anesthetic Checklist: ,, timeout performed, Correct Patient, Correct Site, Correct Laterality, Correct Procedure, Correct Position, site marked, Risks and benefits discussed,  Surgical consent,  Pre-op evaluation,  At surgeon's request and post-op pain management  Laterality: Right  Prep: chloraprep       Needles:  Injection technique: Single-shot  Needle Type: Stimulator Needle - 80          Additional Needles:   Procedures: ultrasound guided,,,,,,,,  Narrative:  Start time: 10/22/2016 9:20 AM End time: 10/22/2016 9:25 AM Injection made incrementally with aspirations every 5 mL.  Performed by: Personally  Anesthesiologist: Ashonte Angelucci  Additional Notes: 25 cc 0.5% Bupivacaine with 1:200 epi

## 2016-10-22 NOTE — Interval H&P Note (Signed)
History and Physical Interval Note:  10/22/2016 8:40 AM  Joanne Vaughn  has presented today for surgery, with the diagnosis of Right knee medial compartment osteoarthritis   The various methods of treatment have been discussed with the patient and family. After consideration of risks, benefits and other options for treatment, the patient has consented to  Procedure(s): RIGHT UNICOMPARTMENTAL KNEE (Right) as a surgical intervention .  The patient's history has been reviewed, patient examined, no change in status, stable for surgery.  I have reviewed the patient's chart and labs.  Questions were answered to the patient's satisfaction.     [name]

## 2016-10-22 NOTE — Anesthesia Procedure Notes (Signed)
Procedure Name: Intubation Date/Time: 10/22/2016 10:02 AM Performed by: Montel Clock Pre-anesthesia Checklist: Patient identified, Emergency Drugs available, Suction available, Patient being monitored and Timeout performed Patient Re-evaluated:Patient Re-evaluated prior to inductionOxygen Delivery Method: Circle system utilized Preoxygenation: Pre-oxygenation with 100% oxygen Intubation Type: IV induction Ventilation: Mask ventilation without difficulty Laryngoscope Size: Mac and 3 Grade View: Grade I Tube type: Oral Tube size: 7.5 mm Number of attempts: 1 Airway Equipment and Method: Stylet Placement Confirmation: ETT inserted through vocal cords under direct vision,  positive ETCO2 and breath sounds checked- equal and bilateral Secured at: 21 cm Tube secured with: Tape Dental Injury: Teeth and Oropharynx as per pre-operative assessment

## 2016-10-22 NOTE — Evaluation (Signed)
Physical Therapy Evaluation Patient Details Name: Joanne Vaughn MRN: [id number] DOB: 1951-03-08 Today's Date: 10/22/2016   History of Present Illness  s/p R medial compartment UKR  Clinical Impression  Pt is s/p medial UKR resulting in the deficits listed below (see PT Problem List).  Pt will benefit from skilled PT to increase their independence and safety with mobility to allow discharge to the venue listed below.   Pt doing well, amb 1' with RW and min/guard and did knee exercises; she is slightly groggy from meds, awake but sleepy during PT session; pt has no steps to enter home and can sleep downstairs but wants to be able to go upstairs in the next day or two  to her bedroom; gave handout on stair/rail/crutch technique, will practice in am if pt does not D/C today; discussed pt status with RN; pt has DME; will follow      Follow Up Recommendations Outpatient PT    Equipment Recommendations  None recommended by PT    Recommendations for Other Services       Precautions / Restrictions Precautions Precautions: Fall Restrictions Weight Bearing Restrictions: No Other Position/Activity Restrictions: WBAT      Mobility  Bed Mobility Overal bed mobility: Needs Assistance Bed Mobility: Supine to Sit     Supine to sit: Supervision     General bed mobility comments: for lines and safety  Transfers Overall transfer level: Needs assistance Equipment used: Rolling walker (2 wheeled) Transfers: Sit to/from Stand Sit to Stand: Min guard         General transfer comment: for safety, min/guard to rise and stabilize  Ambulation/Gait Ambulation/Gait assistance: Min guard Ambulation Distance (Feet): 80 Feet Assistive device: Rolling walker (2 wheeled) Gait Pattern/deviations: Step-to pattern;Decreased weight shift to right     General Gait Details: cues for sequence, wt shift, RW position  Stairs            Wheelchair Mobility    Modified Rankin (Stroke  Patients Only)       Balance                                             Pertinent Vitals/Pain Pain Assessment: 0-10 Pain Score: 2  Pain Location: right knee Pain Descriptors / Indicators: Sore Pain Intervention(s): Limited activity within patient's tolerance;Monitored during session;Premedicated before session    Home Living Family/patient expects to be discharged to:: Private residence Living Arrangements: Spouse/significant other Available Help at Discharge: Family;Available PRN/intermittently Type of Home: House Home Access: Level entry     Home Layout: Two level;Able to live on main level with bedroom/bathroom Home Equipment: Walker - 2 wheels;Crutches;Cane - single point      Prior Function Level of Independence: Independent               Hand Dominance        Extremity/Trunk Assessment   Upper Extremity Assessment Upper Extremity Assessment: Defer to OT evaluation    Lower Extremity Assessment Lower Extremity Assessment: RLE deficits/detail RLE Deficits / Details: ankle WFL; knee flexion ~5 to 60* AAROM, knee extension and hip flexion 3/5, limited by postop pain/weakness       Communication   Communication: No difficulties  Cognition Arousal/Alertness: Awake/alert Behavior During Therapy: WFL for tasks assessed/performed Overall Cognitive Status: Within Functional Limits for tasks assessed  General Comments      Exercises Total Joint Exercises Ankle Circles/Pumps: AROM;Both;10 reps Quad Sets: 10 reps;Both;AROM Heel Slides: AAROM;Right;10 reps Straight Leg Raises: AROM;Right;10 reps   Assessment/Plan    PT Assessment Patient needs continued PT services  PT Problem List Decreased strength;Decreased range of motion;Decreased mobility;Decreased knowledge of use of DME;Decreased knowledge of precautions       PT Treatment Interventions DME instruction;Gait  training;Functional mobility training;Therapeutic activities;Therapeutic exercise;Stair training;Patient/family education    PT Goals (Current goals can be found in the Care Plan section)  Acute Rehab PT Goals Patient Stated Goal: home soon and have less knee pain PT Goal Formulation: With patient/family Time For Goal Achievement: 10/29/16 Potential to Achieve Goals: Good    Frequency 7X/week   Barriers to discharge        Co-evaluation               End of Session Equipment Utilized During Treatment: Gait belt Activity Tolerance: Patient tolerated treatment well Patient left: with call bell/phone within reach;in chair;with family/visitor present;with chair alarm set   PT Visit Diagnosis: Difficulty in walking, not elsewhere classified (R26.2)    Time: 4696-2952 PT Time Calculation (min) (ACUTE ONLY): 21 min   Charges:   PT Evaluation $PT Eval Low Complexity: 1 Procedure     PT G Codes:   PT G-Codes **NOT FOR INPATIENT CLASS** Functional Assessment Tool Used: AM-PAC 6 Clicks Basic Mobility;Clinical judgement Functional Limitation: Mobility: Walking and moving around Mobility: Walking and Moving Around Current Status (W4132): At least 1 percent but less than 20 percent impaired, limited or restricted Mobility: Walking and Moving Around Goal Status [phone]): At least 1 percent but less than 20 percent impaired, limited or restricted    Kenyon Ana, PT Pager: [phone] 10/22/2016   [facility] 10/22/2016, 3:33 PM

## 2016-10-22 NOTE — Anesthesia Preprocedure Evaluation (Signed)
Anesthesia Evaluation  Patient identified by MRN, date of birth, ID band Patient awake    Reviewed: Allergy & Precautions, NPO status , Patient's Chart, lab work & pertinent test results  Airway Mallampati: II  TM Distance: >3 FB Neck ROM: Full    Dental  (+) Teeth Intact, Dental Advisory Given   Pulmonary    breath sounds clear to auscultation       Cardiovascular  Rhythm:Regular     Neuro/Psych    GI/Hepatic   Endo/Other    Renal/GU      Musculoskeletal   Abdominal   Peds  Hematology   Anesthesia Other Findings   Reproductive/Obstetrics                             Anesthesia Physical Anesthesia Plan  ASA: III  Anesthesia Plan: General and Regional   Post-op Pain Management:    Induction: Intravenous  Airway Management Planned: Oral ETT  Additional Equipment:   Intra-op Plan:   Post-operative Plan: Extubation in OR  Informed Consent: I have reviewed the patients History and Physical, chart, labs and discussed the procedure including the risks, benefits and alternatives for the proposed anesthesia with the patient or authorized representative who has indicated his/her understanding and acceptance.   Dental advisory given  Plan Discussed with: CRNA and Anesthesiologist  Anesthesia Plan Comments:         Anesthesia Quick Evaluation

## 2016-10-23 ENCOUNTER — Encounter (HOSPITAL_COMMUNITY): Payer: Self-pay | Admitting: Orthopedic Surgery

## 2016-10-23 DIAGNOSIS — M170 Bilateral primary osteoarthritis of knee: Secondary | ICD-10-CM | POA: Diagnosis not present

## 2016-10-23 MED ORDER — OXYCODONE HCL 5 MG PO TABS: 5.0000 mg | ORAL_TABLET | ORAL | 0 refills | Status: DC | PRN

## 2016-10-23 MED ORDER — RIVAROXABAN 10 MG PO TABS: 10.0000 mg | ORAL_TABLET | Freq: Every day | ORAL | 0 refills | Status: DC

## 2016-10-23 MED ORDER — METHOCARBAMOL 500 MG PO TABS: 500.0000 mg | ORAL_TABLET | Freq: Four times a day (QID) | ORAL | 0 refills | Status: DC | PRN

## 2016-10-23 MED ORDER — TRAMADOL HCL 50 MG PO TABS: 50.0000 mg | ORAL_TABLET | Freq: Four times a day (QID) | ORAL | 0 refills | Status: DC | PRN

## 2016-10-23 NOTE — Evaluation (Signed)
Occupational Therapy Evaluation Patient Details Name: Joanne Vaughn MRN: [id number] DOB: 10/06/1950 Today's Date: 10/23/2016    History of Present Illness s/p R medial compartment UKR   Clinical Impression   Pt is a 66 y/o F who presents with the above. Pt requires MinGuard - occasional MinA for completing ADLs; Pt will have assist from spouse/family PRN to complete ADLs upon return home. Pt has no concerns with completing ADLs upon return home and with family assist. Education provided and questions answered. No further OT needs at this time. Will sign off.     Follow Up Recommendations  No OT follow up;Supervision/Assistance - 24 hour    Equipment Recommendations  3 in 1 bedside commode           Precautions / Restrictions Precautions Precautions: Fall;Knee Restrictions Other Position/Activity Restrictions: WBAT RLE      Mobility Bed Mobility Overal bed mobility: Needs Assistance Bed Mobility: Supine to Sit     Supine to sit: HOB elevated;Supervision        Transfers Overall transfer level: Needs assistance Equipment used: Rolling walker (2 wheeled) Transfers: Sit to/from Stand Sit to Stand: Min guard              Balance Overall balance assessment: No apparent balance deficits (not formally assessed)                                         ADL either performed or assessed with clinical judgement   ADL Overall ADL's : Needs assistance/impaired Eating/Feeding: Independent;Sitting   Grooming: Wash/dry hands;Min guard;Standing   Upper Body Bathing: Min guard;Sitting   Lower Body Bathing: Min guard;Sit to/from stand   Upper Body Dressing : Set up;Sitting   Lower Body Dressing: Minimal assistance;Sit to/from stand   Toilet Transfer: Min guard;Ambulation;BSC;RW Toilet Transfer Details (indicate cue type and reason): BSC over toilet  Toileting- Clothing Manipulation and Hygiene: Min guard;Sit to/from stand   Tub/ Shower Transfer:  Walk-in shower;Min guard;Ambulation;Rolling walker Tub/Shower Transfer Details (indicate cue type and reason): verbal cues for sequencing  Functional mobility during ADLs: Min guard;Rolling walker General ADL Comments: Completed room level functional mobility; reviewed toilet transfer, shower transfer, educated on AE for completing ADL tasks                          Pertinent Vitals/Pain Pain Assessment: Faces Faces Pain Scale: Hurts a little bit Pain Location: right knee Pain Descriptors / Indicators: Sore Pain Intervention(s): Limited activity within patient's tolerance;Monitored during session;Premedicated before session;Ice applied          Extremity/Trunk Assessment Upper Extremity Assessment Upper Extremity Assessment: Overall WFL for tasks assessed           Communication Communication Communication: No difficulties   Cognition Arousal/Alertness: Awake/alert Behavior During Therapy: WFL for tasks assessed/performed Overall Cognitive Status: Within Functional Limits for tasks assessed                                     General Comments                  Home Living Family/patient expects to be discharged to:: Private residence Living Arrangements: Spouse/significant other Available Help at Discharge: Family;Available PRN/intermittently Type of Home: House  Bathroom Shower/Tub: Gaffer;Tub/shower unit (Walk in shower on second level)   Bathroom Toilet: Handicapped height     Home Equipment: Walker - 2 wheels;Shower seat - built in;Grab bars - tub/shower;Hand held shower head          Prior Functioning/Environment Level of Independence: Independent                 OT Problem List: Decreased strength;Decreased activity tolerance            OT Goals(Current goals can be found in the care plan section) Acute Rehab OT Goals Patient Stated Goal: home soon and have less knee pain OT Goal Formulation:  With patient                                 End of Session Equipment Utilized During Treatment: Gait belt;Rolling walker Nurse Communication: Other (comment) (Okay for DC (from OT))  Activity Tolerance: Patient tolerated treatment well Patient left: in bed;with call bell/phone within reach;with nursing/sitter in room (nurse tech )  OT Visit Diagnosis: Muscle weakness (generalized) (M62.81)                Time: 0459-9774 OT Time Calculation (min): 21 min Charges:  OT General Charges $OT Visit: 1 Procedure OT Evaluation $OT Eval Low Complexity: 1 Procedure G-Codes: OT G-codes **NOT FOR INPATIENT CLASS** Functional Assessment Tool Used: Clinical judgement Functional Limitation: Self care Self Care Current Status (F4239): At least 20 percent but less than 40 percent impaired, limited or restricted Self Care Goal Status (R3202): At least 20 percent but less than 40 percent impaired, limited or restricted Self Care Discharge Status [phone]): At least 20 percent but less than 40 percent impaired, limited or restricted   Joanne Vaughn, OT Pager [phone] 10/23/2016   Raymondo Band 10/23/2016, 10:16 AM

## 2016-10-23 NOTE — Progress Notes (Signed)
Discharge plan:  Pt to discharge with Outpatient PT and has an appointment scheduled for 10/24/16. Pt requesting 3in1 and order received. [employer] rep contacted for 3in1. Marney Doctor RN,BSN,NCM [phone]

## 2016-10-23 NOTE — Progress Notes (Signed)
Physical Therapy Treatment Patient Details Name: Joanne Vaughn MRN: [id number] DOB: 1951-04-14 Today's Date: 10/23/2016    History of Present Illness s/p R medial compartment UKR    PT Comments    The patient is ready for DC.   Follow Up Recommendations  Outpatient PT     Equipment Recommendations       Recommendations for Other Services       Precautions / Restrictions Precautions Precautions: Fall;Knee Required Braces or Orthoses: Knee Immobilizer - Right Knee Immobilizer - Right: Discontinue once straight leg raise with < 10 degree lag Restrictions Other Position/Activity Restrictions: WBAT RLE    Mobility  Bed Mobility Overal bed mobility: Modified Independent Bed Mobility: Supine to Sit     Supine to sit: HOB elevated;Supervision        Transfers Overall transfer level: Needs assistance Equipment used: Rolling walker (2 wheeled) Transfers: Sit to/from Stand Sit to Stand: Supervision            Ambulation/Gait Ambulation/Gait assistance: Supervision Ambulation Distance (Feet): 120 Feet Assistive device: Rolling walker (2 wheeled) Gait Pattern/deviations: Step-to pattern;Step-through pattern     General Gait Details: cues for sequence, wt shift, RW position   Stairs Stairs: Yes   Stair Management: One rail Right;Step to pattern;Forwards;With cane      Wheelchair Mobility    Modified Rankin (Stroke Patients Only)       Balance Overall balance assessment: No apparent balance deficits (not formally assessed)                                          Cognition Arousal/Alertness: Awake/alert Behavior During Therapy: WFL for tasks assessed/performed Overall Cognitive Status: Within Functional Limits for tasks assessed                                        Exercises Total Joint Exercises Ankle Circles/Pumps: AROM;Both;10 reps Quad Sets: 10 reps;Both;AROM Short Arc Quad: AROM;Right;10 reps Heel  Slides: AAROM;Right;10 reps Hip ABduction/ADduction: AROM;Right;10 reps Straight Leg Raises: AROM;Right;10 reps Goniometric ROM: 10-60 right knee flex    General Comments        Pertinent Vitals/Pain Pain Assessment: Faces Pain Score: 2  Faces Pain Scale: Hurts a little bit Pain Location: right knee Pain Descriptors / Indicators: Sore Pain Intervention(s): Premedicated before session;Ice applied    Home Living Family/patient expects to be discharged to:: Private residence Living Arrangements: Spouse/significant other Available Help at Discharge: Family;Available PRN/intermittently Type of Home: House       Home Equipment: Walker - 2 wheels;Shower seat - built in;Grab bars - tub/shower;Hand held shower head      Prior Function Level of Independence: Independent          PT Goals (current goals can now be found in the care plan section) Acute Rehab PT Goals Patient Stated Goal: home soon and have less knee pain Progress towards PT goals: Progressing toward goals    Frequency    7X/week      PT Plan Current plan remains appropriate    Co-evaluation             End of Session Equipment Utilized During Treatment: Right knee immobilizer Activity Tolerance: Patient tolerated treatment well Patient left: in bed;with call bell/phone within reach Nurse Communication: Mobility status PT Visit Diagnosis: Difficulty in  walking, not elsewhere classified (R26.2)     Time: 9090-3014 PT Time Calculation (min) (ACUTE ONLY): 40 min  Charges:  $Gait Training: 8-22 mins $Therapeutic Exercise: 8-22 mins $Self Care/Home Management: 8-22                    G CodesTresa Endo PT [phone]    Claretha Cooper 10/23/2016, 1:19 PM

## 2016-10-23 NOTE — Discharge Summary (Signed)
Physician Discharge Summary   Patient ID: Joanne Vaughn MRN: [id number] DOB/AGE: Sep 26, 1950 66 y.o.  Admit date: 10/22/2016 Discharge date: 10-23-2016  Primary Diagnosis:  Medial compartment osteoarthritis, Right knee  Admission Diagnoses:  Past Medical History:  Diagnosis Date  . Arthritis   . Breast CA ([place])    sister  . Chicken pox   . Diverticulosis   . Heart murmur    since childhood, benign   Discharge Diagnoses:   Principal Problem:   Primary localized osteoarthritis of right knee Active Problems:   OA (osteoarthritis) of knee  Estimated body mass index is 24.51 kg/m as calculated from the following:   Height as of this encounter: _0  (1.575 m).   Weight as of this encounter: 60.8 kg (134 lb).  Procedure:  Procedure(s) (LRB): RIGHT UNICOMPARTMENTAL KNEE (Right)   Consults: None  HPI: Joanne Vaughn is a 66 y.o. female, who has  significant isolated medial compartment arthritis of the Right knee. The patient has had nonoperative management including injections of cortisone and viscous supplements. Unfortunately, the pain persists.  Radiograph showed isolated medial compartment bone-on-bone arthritis  with normal-appearing patellofemoral and lateral compartments. The patient presents now for left knee unicompartmental arthroplasty.   Laboratory Data: Hospital Outpatient Visit on 10/16/2016  Component Date Value Ref Range Status  . aPTT 10/16/2016 31  24 - 36 seconds Final  . WBC 10/16/2016 6.7  4.0 - 10.5 K/uL Final  . RBC 10/16/2016 4.34  3.87 - 5.11 MIL/uL Final  . Hemoglobin 10/16/2016 13.2  12.0 - 15.0 g/dL Final  . HCT 10/16/2016 40.0  36.0 - 46.0 % Final  . MCV 10/16/2016 92.2  78.0 - 100.0 fL Final  . MCH 10/16/2016 30.4  26.0 - 34.0 pg Final  . MCHC 10/16/2016 33.0  30.0 - 36.0 g/dL Final  . RDW 10/16/2016 12.6  11.5 - 15.5 % Final  . Platelets 10/16/2016 239  150 - 400 K/uL Final  . Sodium 10/16/2016 140  135 - 145 mmol/L Final  . Potassium  10/16/2016 4.5  3.5 - 5.1 mmol/L Final  . Chloride 10/16/2016 103  101 - 111 mmol/L Final  . CO2 10/16/2016 30  22 - 32 mmol/L Final  . Glucose, Bld 10/16/2016 90  65 - 99 mg/dL Final  . BUN 10/16/2016 15  6 - 20 mg/dL Final  . Creatinine, Ser 10/16/2016 0.71  0.44 - 1.00 mg/dL Final  . Calcium 10/16/2016 9.1  8.9 - 10.3 mg/dL Final  . Total Protein 10/16/2016 7.7  6.5 - 8.1 g/dL Final  . Albumin 10/16/2016 4.4  3.5 - 5.0 g/dL Final  . AST 10/16/2016 22  15 - 41 U/L Final  . ALT 10/16/2016 16  14 - 54 U/L Final  . Alkaline Phosphatase 10/16/2016 70  38 - 126 U/L Final  . Total Bilirubin 10/16/2016 0.6  0.3 - 1.2 mg/dL Final  . GFR calc non Af Amer 10/16/2016 >60  >60 mL/min Final  . GFR calc Af Amer 10/16/2016 >60  >60 mL/min Final   Comment: (NOTE) The eGFR has been calculated using the CKD EPI equation. This calculation has not been validated in all clinical situations. eGFR's persistently <60 mL/min signify possible Chronic Kidney Disease.   . Anion gap 10/16/2016 7  5 - 15 Final  . Prothrombin Time 10/16/2016 13.0  11.4 - 15.2 seconds Final  . INR 10/16/2016 0.98   Final  . ABO/RH(D) 10/16/2016 O POS   Final  . Antibody Screen 10/16/2016 NEG  Final  . Sample Expiration 10/16/2016 10/25/2016   Final  . Extend sample reason 10/16/2016 NO TRANSFUSIONS OR PREGNANCY IN THE PAST 3 MONTHS   Final  . MRSA, PCR 10/16/2016 NEGATIVE  NEGATIVE Final  . Staphylococcus aureus 10/16/2016 NEGATIVE  NEGATIVE Final   Comment:        The Xpert SA Assay (FDA approved for NASAL specimens in patients over 66 years of age), is one component of a comprehensive surveillance program.  Test performance has been validated by [facility] for patients greater than or equal to 72 year old. It is not intended to diagnose infection nor to guide or monitor treatment.   . ABO/RH(D) 10/16/2016 O POS   Final  Office Visit on 09/17/2016  Component Date Value Ref Range Status  . Hgb A1c MFr Bld  09/17/2016 5.7  4.6 - 6.5 % Final  . Cholesterol 09/17/2016 227* 0 - 200 mg/dL Final  . Triglycerides 09/17/2016 255.0* 0.0 - 149.0 mg/dL Final  . HDL 09/17/2016 43.20  >39.00 mg/dL Final  . VLDL 09/17/2016 51.0* 0.0 - 40.0 mg/dL Final  . Total CHOL/HDL Ratio 09/17/2016 5   Final  . NonHDL 09/17/2016 183.37   Final  . TSH 09/17/2016 3.07  0.35 - 4.50 uIU/mL Final  . Direct LDL 09/17/2016 123.0  mg/dL Final     X-Rays:No results found.  EKG: Orders placed or performed during the hospital encounter of 09/16/13  . EKG 12-Lead  . EKG 12-Lead  . EKG     Hospital Course: Joanne Vaughn is a 66 y.o. who was admitted to [facility]. They were brought to the operating room on 10/22/2016 and underwent Procedure(s): RIGHT UNICOMPARTMENTAL KNEE.  Patient tolerated the procedure well and was later transferred to the recovery room and then to the orthopaedic floor for postoperative care.  They were given PO and IV analgesics for pain control following their surgery.  They were given 24 hours of postoperative antibiotics of  Anti-infectives    Start     Dose/Rate Route Frequency Ordered Stop   10/22/16 1600  ceFAZolin (ANCEF) IVPB 2g/100 mL premix     2 g 200 mL/hr over 30 Minutes Intravenous Every 6 hours 10/22/16 1226 10/23/16 0222   10/22/16 0846  ceFAZolin (ANCEF) 2-4 GM/100ML-% IVPB    Comments:  Joanne Vaughn   : cabinet override      10/22/16 0846 10/22/16 1003   10/22/16 0820  ceFAZolin (ANCEF) IVPB 2g/100 mL premix     2 g 200 mL/hr over 30 Minutes Intravenous On call to O.R. 10/22/16 0820 10/22/16 1006     and started on DVT prophylaxis in the form of Xarelto.   PT and OT were ordered for postop therapy protocol.  Discharge planning consulted to help with postop disposition and equipment needs.  Patient had a good night on the evening of surgery.  They started to get up OOB with therapy on day one. Hemovac drain was pulled without difficulty.  Patient was seen in rounds on day  one and it was felt that as long as they did well with the remaining sessions of therapy that they would be ready to go home.  Arrangements were made and they were setup to go home on POD 1.  Discharge home following therapy Diet - Regular diet Follow up - in 2 weeks Activity - WBAT Dressing - May remove the surgical dressing tomorrow at home and then apply a dry gauze dressing daily. May shower three days following  surgery but do not submerge the incision under water. Disposition - Home Condition Upon Discharge - Stable D/C Meds - See DC Summary DVT Prophylaxis Xarelto 10 mg daily for ten days, then change to Aspirin 325 mg daily for two weeks, then reduce to Baby Aspirin 81 mg daily for three additional weeks.  Discharge Instructions    Call MD / Call 911    Complete by:  As directed    If you experience chest pain or shortness of breath, CALL 911 and be transported to the hospital emergency room.  If you develope a fever above 101 F, pus (white drainage) or increased drainage or redness at the wound, or calf pain, call your surgeon's office.   Change dressing    Complete by:  As directed    Change dressing daily with sterile 4 x 4 inch gauze dressing and apply TED hose. Do not submerge the incision under water.   Constipation Prevention    Complete by:  As directed    Drink plenty of fluids.  Prune juice may be helpful.  You may use a stool softener, such as Colace (over the counter) 100 mg twice a day.  Use MiraLax (over the counter) for constipation as needed.   Diet general    Complete by:  As directed    Discharge instructions    Complete by:  As directed    Pick up stool softner and laxative for home use following surgery while on pain medications. Do not submerge incision under water. Please use good hand washing techniques while changing dressing each day. May shower starting three days after surgery. Please use a clean towel to pat the incision dry following  showers. Continue to use ice for pain and swelling after surgery. Do not use any lotions or creams on the incision until instructed by your surgeon.  Wear both TED hose on both legs during the day every day for three weeks, but may have off at night at home.  Postoperative Constipation Protocol  Constipation - defined medically as fewer than three stools per week and severe constipation as less than one stool per week.  One of the most common issues patients have following surgery is constipation.  Even if you have a regular bowel pattern at home, your normal regimen is likely to be disrupted due to multiple reasons following surgery.  Combination of anesthesia, postoperative narcotics, change in appetite and fluid intake all can affect your bowels.  In order to avoid complications following surgery, here are some recommendations in order to help you during your recovery period.  Colace (docusate) - Pick up an over-the-counter form of Colace or another stool softener and take twice a day as long as you are requiring postoperative pain medications.  Take with a full glass of water daily.  If you experience loose stools or diarrhea, hold the colace until you stool forms back up.  If your symptoms do not get better within 1 week or if they get worse, check with your doctor.  Dulcolax (bisacodyl) - Pick up over-the-counter and take as directed by the product packaging as needed to assist with the movement of your bowels.  Take with a full glass of water.  Use this product as needed if not relieved by Colace only.   MiraLax (polyethylene glycol) - Pick up over-the-counter to have on hand.  MiraLax is a solution that will increase the amount of water in your bowels to assist with bowel movements.  Take as directed  and can mix with a glass of water, juice, soda, coffee, or tea.  Take if you go more than two days without a movement. Do not use MiraLax more than once per day. Call your doctor if you are  still constipated or irregular after using this medication for 7 days in a row.  If you continue to have problems with postoperative constipation, please contact the office for further assistance and recommendations.  If you experience "the worst abdominal pain ever" or develop nausea or vomiting, please contact the office immediatly for further recommendations for treatment.   Xarelto 10 mg daily for ten days, then change to Aspirin 325 mg daily for two weeks, then reduce to Baby Aspirin 81 mg daily for three additional weeks.    Do not put a pillow under the knee. Place it under the heel.    Complete by:  As directed    Do not sit on low chairs, stoools or toilet seats, as it may be difficult to get up from low surfaces    Complete by:  As directed    Driving restrictions    Complete by:  As directed    No driving until released by the physician.   Increase activity slowly as tolerated    Complete by:  As directed    Lifting restrictions    Complete by:  As directed    No lifting until released by the physician.   Patient may shower    Complete by:  As directed    You may shower without a dressing once there is no drainage.  Do not wash over the wound.  If drainage remains, do not shower until drainage stops.   TED hose    Complete by:  As directed    Use stockings (TED hose) for 3 weeks on both leg(s).  You may remove them at night for sleeping.   Weight bearing as tolerated    Complete by:  As directed    Laterality:  right   Extremity:  Lower     Allergies as of 10/23/2016      Reactions   Statins Nausea Only, Other (See Comments)   Weakness and achy  intolerance      Medication List    STOP taking these medications   aspirin-acetaminophen-caffeine 250-250-65 MG tablet Commonly known as:  EXCEDRIN MIGRAINE   B-12 2500 MCG Tabs   naproxen sodium 220 MG tablet Commonly known as:  ANAPROX   Vitamin D 2000 units Caps     TAKE these medications   levothyroxine 100  MCG tablet Commonly known as:  SYNTHROID, LEVOTHROID TAKE ONE TABLET BY MOUTH ONCE DAILY   methocarbamol 500 MG tablet Commonly known as:  ROBAXIN Take 1 tablet (500 mg total) by mouth every 6 (six) hours as needed for muscle spasms.   oxyCODONE 5 MG immediate release tablet Commonly known as:  Oxy IR/ROXICODONE Take 1-2 tablets (5-10 mg total) by mouth every 4 (four) hours as needed for moderate pain or severe pain.   rivaroxaban 10 MG Tabs tablet Commonly known as:  XARELTO Take 1 tablet (10 mg total) by mouth daily with breakfast. Xarelto 10 mg daily for ten days, then change to Aspirin 325 mg daily for two weeks, then reduce to Baby Aspirin 81 mg daily for three additional weeks.   traMADol 50 MG tablet Commonly known as:  ULTRAM Take 1-2 tablets (50-100 mg total) by mouth every 6 (six) hours as needed for moderate pain. What changed:  how much to take  when to take this      Follow-up Information    [name], MD. Schedule an appointment as soon as possible for a visit on 11/04/2016.   Specialty:  Orthopedic Surgery Contact information: [address] [place] [address] [phone]           Signed: Arlee Muslim, PA-C Orthopaedic Surgery 10/23/2016, 7:17 AM

## 2016-10-23 NOTE — Discharge Instructions (Addendum)
° °Dr. Frank Aluisio °Total Joint Specialist °[facility] °[address] °[place], [place] [address] °[phone] ° °UNI KNEE REPLACEMENT POSTOPERATIVE DIRECTIONS ° ° °Knee Rehabilitation, Guidelines Following Surgery  °Results after knee surgery are often greatly improved when you follow the exercise, range of motion and muscle strengthening exercises prescribed by your doctor. Safety measures are also important to protect the knee from further injury. Any time any of these exercises cause you to have increased pain or swelling in your knee joint, decrease the amount until you are comfortable again and slowly increase them. If you have problems or questions, call your caregiver or physical therapist for advice.  ° °HOME CARE INSTRUCTIONS  °Remove items at home which could result in a fall. This includes throw rugs or furniture in walking pathways.  °· ICE to the affected knee every three hours for 30 minutes at a time and then as needed for pain and swelling.  Continue to use ice on the knee for pain and swelling from surgery. You may notice swelling that will progress down to the foot and ankle.  This is normal after surgery.  Elevate the leg when you are not up walking on it.   °· Continue to use the breathing machine which will help keep your temperature down.  It is common for your temperature to cycle up and down following surgery, especially at night when you are not up moving around and exerting yourself.  The breathing machine keeps your lungs expanded and your temperature down. °· Do not place pillow under knee, focus on keeping the knee straight while resting ° °DIET °You may resume your previous home diet once your are discharged from the hospital. ° °DRESSING / WOUND CARE / SHOWERING °You may shower 3 days after surgery, but keep the wounds dry during showering.  You may use an occlusive plastic wrap (Press'n Seal for example), NO SOAKING/SUBMERGING IN THE BATHTUB.  If the  bandage gets wet, change with a clean dry gauze.  If the incision gets wet, pat the wound dry with a clean towel. °You may start showering once you are discharged home but do not submerge the incision under water. Just pat the incision dry and apply a dry gauze dressing on daily. °Change the surgical dressing daily and reapply a dry dressing each time. ° °ACTIVITY °Walk with your walker as instructed. °Use walker as long as suggested by your caregivers. °Avoid periods of inactivity such as sitting longer than an hour when not asleep. This helps prevent blood clots.  °You may resume a sexual relationship in one month or when given the OK by your doctor.  °You may return to work once you are cleared by your doctor.  °Do not drive a car for 6 weeks or until released by you surgeon.  °Do not drive while taking narcotics. ° °WEIGHT BEARING °Weight bearing as tolerated with assist device (walker, cane, etc) as directed, use it as long as suggested by your surgeon or therapist, typically at least 4-6 weeks. ° °POSTOPERATIVE CONSTIPATION PROTOCOL °Constipation - defined medically as fewer than three stools per week and severe constipation as less than one stool per week. ° °One of the most common issues patients have following surgery is constipation.  Even if you have a regular bowel pattern at home, your normal regimen is likely to be disrupted due to multiple reasons following surgery.  Combination of anesthesia, postoperative narcotics, change in appetite and fluid intake all can affect your bowels.    In order to avoid complications following surgery, here are some recommendations in order to help you during your recovery period.  Colace (docusate) - Pick up an over-the-counter form of Colace or another stool softener and take twice a day as long as you are requiring postoperative pain medications.  Take with a full glass of water daily.  If you experience loose stools or diarrhea, hold the colace until you stool forms  back up.  If your symptoms do not get better within 1 week or if they get worse, check with your doctor.  Dulcolax (bisacodyl) - Pick up over-the-counter and take as directed by the product packaging as needed to assist with the movement of your bowels.  Take with a full glass of water.  Use this product as needed if not relieved by Colace only.   MiraLax (polyethylene glycol) - Pick up over-the-counter to have on hand.  MiraLax is a solution that will increase the amount of water in your bowels to assist with bowel movements.  Take as directed and can mix with a glass of water, juice, soda, coffee, or tea.  Take if you go more than two days without a movement. Do not use MiraLax more than once per day. Call your doctor if you are still constipated or irregular after using this medication for 7 days in a row.  If you continue to have problems with postoperative constipation, please contact the office for further assistance and recommendations.  If you experience "the worst abdominal pain ever" or develop nausea or vomiting, please contact the office immediatly for further recommendations for treatment.  ITCHING  If you experience itching with your medications, try taking only a single pain pill, or even half a pain pill at a time.  You can also use Benadryl over the counter for itching or also to help with sleep.   TED HOSE STOCKINGS Wear the elastic stockings on both legs for three weeks following surgery during the day but you may remove then at night for sleeping.  MEDICATIONS See your medication summary on the After Visit Summary that the nursing staff will review with you prior to discharge.  You may have some home medications which will be placed on hold until you complete the course of blood thinner medication.  It is important for you to complete the blood thinner medication as prescribed by your surgeon.  Continue your approved medications as instructed at time of  discharge.  PRECAUTIONS If you experience chest pain or shortness of breath - call 911 immediately for transfer to the hospital emergency department.  If you develop a fever greater that 101 F, purulent drainage from wound, increased redness or drainage from wound, foul odor from the wound/dressing, or calf pain - CONTACT YOUR SURGEON.                                                   FOLLOW-UP APPOINTMENTS Make sure you keep all of your appointments after your operation with your surgeon and caregivers. You should call the office at the above phone number and make an appointment for approximately two weeks after the date of your surgery or on the date instructed by your surgeon outlined in the "After Visit Summary".  RANGE OF MOTION AND STRENGTHENING EXERCISES  Rehabilitation of the knee is important following a knee injury or an  operation. After just a few days of immobilization, the muscles of the thigh which control the knee become weakened and shrink (atrophy). Knee exercises are designed to build up the tone and strength of the thigh muscles and to improve knee motion. Often times heat used for twenty to thirty minutes before working out will loosen up your tissues and help with improving the range of motion but do not use heat for the first two weeks following surgery. These exercises can be done on a training (exercise) mat, on the floor, on a table or on a bed. Use what ever works the best and is most comfortable for you Knee exercises include:  Leg Lifts - While your knee is still immobilized in a splint or cast, you can do straight leg raises. Lift the leg to 60 degrees, hold for 3 sec, and slowly lower the leg. Repeat 10-20 times 2-3 times daily. Perform this exercise against resistance later as your knee gets better.  Quad and Hamstring Sets - Tighten up the muscle on the front of the thigh (Quad) and hold for 5-10 sec. Repeat this 10-20 times hourly. Hamstring sets are done by pushing the  foot backward against an object and holding for 5-10 sec. Repeat as with quad sets.   Leg Slides: Lying on your back, slowly slide your foot toward your buttocks, bending your knee up off the floor (only go as far as is comfortable). Then slowly slide your foot back down until your leg is flat on the floor again.  Angel Wings: Lying on your back spread your legs to the side as far apart as you can without causing discomfort.  A rehabilitation program following serious knee injuries can speed recovery and prevent re-injury in the future due to weakened muscles. Contact your doctor or a physical therapist for more information on knee rehabilitation.   IF YOU ARE TRANSFERRED TO A SKILLED REHAB FACILITY If the patient is transferred to a skilled rehab facility following release from the hospital, a list of the current medications will be sent to the facility for the patient to continue.  When discharged from the skilled rehab facility, please have the facility set up the patient's [place] prior to being released. Also, the skilled facility will be responsible for providing the patient with their medications at time of release from the facility to include their pain medication, the muscle relaxants, and their blood thinner medication. If the patient is still at the rehab facility at time of the two week follow up appointment, the skilled rehab facility will also need to assist the patient in arranging follow up appointment in our office and any transportation needs.  MAKE SURE YOU:  Understand these instructions.  Get help right away if you are not doing well or get worse.    Pick up stool softner and laxative for home use following surgery while on pain medications. Do not submerge incision under water. Please use good hand washing techniques while changing dressing each day. May shower starting three days after surgery. Please use a clean towel to pat the incision dry following  showers. Continue to use ice for pain and swelling after surgery. Do not use any lotions or creams on the incision until instructed by your surgeon.   Take Xarelto 10 mg daily for ten days, then change to Aspirin 325 mg daily for two weeks, then reduce to Baby Aspirin 81 mg daily for three additional weeks.  Information on my medicine -  XARELTO (Rivaroxaban)  This medication education was reviewed with me or my healthcare representative as part of my discharge preparation.  The pharmacist that spoke with me during my hospital stay was:  Minda Ditto, [facility]  Why was Xarelto prescribed for you? Xarelto was prescribed for you to reduce the risk of blood clots forming after orthopedic surgery. The medical term for these abnormal blood clots is venous thromboembolism (VTE).  What do you need to know about xarelto ? Take your Xarelto ONCE DAILY at the same time every day. You may take it either with or without food.  If you have difficulty swallowing the tablet whole, you may crush it and mix in applesauce just prior to taking your dose.  Take Xarelto exactly as prescribed by your doctor and DO NOT stop taking Xarelto without talking to the doctor who prescribed the medication.  Stopping without other VTE prevention medication to take the place of Xarelto may increase your risk of developing a clot.  After discharge, you should have regular check-up appointments with your healthcare provider that is prescribing your Xarelto.    What do you do if you miss a dose? If you miss a dose, take it as soon as you remember on the same day then continue your regularly scheduled once daily regimen the next day. Do not take two doses of Xarelto on the same day.   Important Safety Information A possible side effect of Xarelto is bleeding. You should call your healthcare provider right away if you experience any of the following: ? Bleeding from an injury or your nose that does not  stop. ? Unusual colored urine (red or dark brown) or unusual colored stools (red or black). ? Unusual bruising for unknown reasons. ? A serious fall or if you hit your head (even if there is no bleeding).  Some medicines may interact with Xarelto and might increase your risk of bleeding while on Xarelto. To help avoid this, consult your healthcare provider or pharmacist prior to using any new prescription or non-prescription medications, including herbals, vitamins, non-steroidal anti-inflammatory drugs (NSAIDs) and supplements.  Discussed holding Naproxen and Excedrin while on Xarelto  This website has more information on Xarelto: [email or web address]

## 2016-10-23 NOTE — Progress Notes (Signed)
Pt to d/c home. Plan is for outpatient PT. Patient understands this process. DME (3n1) to be delivered to room prior to d/c. AVS reviewed and "My Chart" discussed with pt. Prescriptions given and explained. Pt capable of verbalizing medications, dressing changes, signs and symptoms of infection, and follow-up appointments. Remains hemodynamically stable. No signs and symptoms of distress. Educated pt to return to ER in the case of SOB, dizziness, or chest pain.

## 2016-10-23 NOTE — Progress Notes (Signed)
   Subjective: 1 Day Post-Op Procedure(s) (LRB): RIGHT UNICOMPARTMENTAL KNEE (Right) Patient reports pain as mild.   Patient seen in rounds by Dr. Wynelle Link. Patient is well, but has had some minor complaints of pain in the knee, requiring pain medications Patient is ready to go home later today following therapy  Objective: Vital signs in last 24 hours: Temp:  [97.6 F (36.4 C)-98.4 F (36.9 C)] 98.3 F (36.8 C) (04/19 0523) Pulse Rate:  [56-76] 70 (04/19 0523) Resp:  [0-18] 14 (04/19 0523) BP: (96-142)/(40-61) 121/44 (04/19 0523) SpO2:  [94 %-100 %] 97 % (04/19 0523) Weight:  [60.8 kg (134 lb)-61 kg (134 lb 8 oz)] 60.8 kg (134 lb) (04/18 1220)  Intake/Output from previous day:  Intake/Output Summary (Last 24 hours) at 10/23/16 0711 Last data filed at 10/23/16 0524  Gross per 24 hour  Intake           3847.5 ml  Output             1465 ml  Net           2382.5 ml    Intake/Output this shift: No intake/output data recorded.  Labs: No results for input(s): HGB in the last 72 hours. No results for input(s): WBC, RBC, HCT, PLT in the last 72 hours. No results for input(s): NA, K, CL, CO2, BUN, CREATININE, GLUCOSE, CALCIUM in the last 72 hours. No results for input(s): LABPT, INR in the last 72 hours.  EXAM: General - Patient is Alert, Appropriate and Oriented Extremity - Neurovascular intact Sensation intact distally Intact pulses distally Dorsiflexion/Plantar flexion intact Dressing - clean, dry, no drainage Motor Function - intact, moving foot and toes well on exam.  Hemovac pulled without difficulty.  Assessment/Plan: 1 Day Post-Op Procedure(s) (LRB): RIGHT UNICOMPARTMENTAL KNEE (Right) Procedure(s) (LRB): RIGHT UNICOMPARTMENTAL KNEE (Right) Past Medical History:  Diagnosis Date  . Arthritis   . Breast CA ([place])    sister  . Chicken pox   . Diverticulosis   . Heart murmur    since childhood, benign   Principal Problem:   Primary localized osteoarthritis  of right knee Active Problems:   OA (osteoarthritis) of knee  Estimated body mass index is 24.51 kg/m as calculated from the following:   Height as of this encounter: 5\' 2"  (1.575 m).   Weight as of this encounter: 60.8 kg (134 lb). Advance diet Up with therapy Discharge home following therapy Diet - Regular diet Follow up - in 2 weeks Activity - WBAT Dressing - May remove the surgical dressing tomorrow at home and then apply a dry gauze dressing daily. May shower three days following surgery but do not submerge the incision under water. Disposition - Home Condition Upon Discharge - Stable D/C Meds - See DC Summary DVT Prophylaxis Xarelto 10 mg daily for ten days, then change to Aspirin 325 mg daily for two weeks, then reduce to Baby Aspirin 81 mg daily for three additional weeks.  Arlee Muslim, PA-C Orthopaedic Surgery 10/23/2016, 7:11 AM

## 2016-11-04 ENCOUNTER — Telehealth: Payer: Self-pay | Admitting: Family Medicine

## 2016-11-04 NOTE — Telephone Encounter (Signed)
Patient's spouse called in stating that [facility] advised we coded the 09-30-22 CPE incorrectly.  Upon looking at the Tx history the codes looked correct to me.  I called [facility] as I advised the member I would, and after 45 minutes I spoke to a rep who advised that they do not cover "routine physicals" but instead "preventive care" or "Well Woman" visits.  Can I send this to [facility] with a different code that would fall under preventative?  Or should I send it to Dr. Maudie Mercury and/or Mechele Claude?

## 2017-02-12 ENCOUNTER — Ambulatory Visit: Admitting: Family Medicine

## 2017-02-26 NOTE — Addendum Note (Signed)
Addendum  created 02/26/17 1323 by Roberts Gaudy, MD   Sign clinical note

## 2017-03-20 ENCOUNTER — Ambulatory Visit: Admitting: Family Medicine

## 2017-03-26 ENCOUNTER — Encounter: Payer: Self-pay | Admitting: Family Medicine

## 2017-04-30 ENCOUNTER — Ambulatory Visit (INDEPENDENT_AMBULATORY_CARE_PROVIDER_SITE_OTHER): Admitting: *Deleted

## 2017-04-30 DIAGNOSIS — Z23 Encounter for immunization: Secondary | ICD-10-CM

## 2017-05-04 ENCOUNTER — Other Ambulatory Visit: Payer: Self-pay | Admitting: Family Medicine

## 2017-05-04 DIAGNOSIS — Z1231 Encounter for screening mammogram for malignant neoplasm of breast: Secondary | ICD-10-CM

## 2017-05-12 ENCOUNTER — Ambulatory Visit: Admitting: Family Medicine

## 2017-05-12 DIAGNOSIS — Z0289 Encounter for other administrative examinations: Secondary | ICD-10-CM

## 2017-05-25 ENCOUNTER — Ambulatory Visit (INDEPENDENT_AMBULATORY_CARE_PROVIDER_SITE_OTHER): Admitting: Family Medicine

## 2017-05-25 ENCOUNTER — Encounter: Payer: Self-pay | Admitting: Family Medicine

## 2017-05-25 VITALS — BP 118/50 | HR 73 | Temp 98.0°F | Ht 62.0 in | Wt 137.5 lb

## 2017-05-25 DIAGNOSIS — L03011 Cellulitis of right finger: Secondary | ICD-10-CM | POA: Diagnosis not present

## 2017-05-25 MED ORDER — SULFAMETHOXAZOLE-TRIMETHOPRIM 800-160 MG PO TABS: 1.0000 | ORAL_TABLET | Freq: Two times a day (BID) | ORAL | 0 refills | Status: DC

## 2017-05-25 NOTE — Progress Notes (Signed)
HPI:  Joanne Vaughn is a pleasant 66 year old here for an acute visit for paronychia: -She injured her finger about a week ago, slight nick in the nail, has had some redness and purulent discharge for last few days fom the side of the R thumb nail  -No fevers, chills, malaise or worsening redness -She is worried about MRSA, an acquaintance has MRSA  ROS: See pertinent positives and negatives per HPI.  Past Medical History:  Diagnosis Date  . Arthritis   . Breast CA ([place])    sister  . Chicken pox   . Diverticulosis   . Heart murmur    since childhood, benign    Past Surgical History:  Procedure Laterality Date  . ABDOMINAL HYSTERECTOMY    . BREAST BIOPSY  1973   benign  . FRACTURE SURGERY     Fracture of  L Hip and L Wrist 2015  . INTRAMEDULLARY (IM) NAIL INTERTROCHANTRIC Left 09/17/2013   Performed by [name], MD at [facility] ORS  . ovaries removed  1994  . RIGHT UNICOMPARTMENTAL KNEE Right 10/22/2016   Performed by Gaynelle Arabian, MD at [facility] ORS    Family History  Problem Relation Age of Onset  . Arthritis Unknown        parent/grandparent  . Heart disease Unknown        parent/grandparent  . Diabetes Other   . Heart disease Mother 57       MI  . COPD Father   . Osteoarthritis Father   . Diabetes Sister   . Asthma Sister     Social History   Socioeconomic History  . Marital status: Married    Spouse name: None  . Number of children: None  . Years of education: None  . Highest education level: None  Social Needs  . Financial resource strain: None  . Food insecurity - worry: None  . Food insecurity - inability: None  . Transportation needs - medical: None  . Transportation needs - non-medical: None  Occupational History  . None  Tobacco Use  . Smoking status: Never Smoker  . Smokeless tobacco: Never Used  Substance and Sexual Activity  . Alcohol use: No  . Drug use: No  . Sexual activity: None  Other Topics Concern  . None  Social History  Narrative   Work or School: homemaker, [employer] in [place]      Home Situation: lives with husband      Spiritual Beliefs: Mina Marble, Darrick Meigs      Lifestyle: elliptical 20 minutes a few times per week, very active - gardening, planting, healthy diet                 Current Outpatient Medications:  .  levothyroxine (SYNTHROID, LEVOTHROID) 100 MCG tablet, TAKE ONE TABLET BY MOUTH ONCE DAILY, Disp: 90 tablet, Rfl: 3 .  sulfamethoxazole-trimethoprim (BACTRIM DS,SEPTRA DS) 800-160 MG tablet, Take 1 tablet 2 (two) times daily by mouth., Disp: 10 tablet, Rfl: 0  EXAM:  Vitals:   05/25/17 1152  BP: (!) 118/50  Pulse: 73  Temp: 98 F (36.7 C)    Body mass index is 25.15 kg/m.  GENERAL: vitals reviewed and listed above, alert, oriented, appears well hydrated and in no acute distress  HEENT: atraumatic, conjunttiva clear, no obvious abnormalities on inspection of external nose and ears  NECK: no obvious masses on inspection  LUNGS: clear to auscultation bilaterally, no wheezes, rales or rhonchi, good air movement  SKIN: Mild erythema  and edema around the right thumbnail, no fluctuance or abscess appreciated  PSYCH: pleasant and cooperative, no obvious depression or anxiety  ASSESSMENT AND PLAN:  Discussed the following assessment and plan:  Paronychia of finger of right hand  Cellulitis of finger of right hand  -Paronychia with mild cellulitis, appears to have drained on its own -She is very concerned about this infection and the holidays so we will start an antibiotic, will cover MRSA given purulence and her concerns -Warm salt water soaks -Good care of the hands with avoidance of water and dirt exposure advised -Patient advised to return or notify a doctor immediately if symptoms worsen or persist or new concerns arise.  Patient Instructions    Please take the antibiotic, Bactrim, as prescribed.  Soak the hand in warm salt water twice daily  then dry thoroughly.  Keep hands clean and dry.  Please wear gloves for all cooking and cleaning and dirty work.  TheI hope you are feeling better soon! Seek care promptly if your symptoms worsen, new concerns arise or you are not improving with treatment.     Colin Benton R., DO

## 2017-05-25 NOTE — Patient Instructions (Addendum)
   Please take the antibiotic, Bactrim, as prescribed.  Soak the hand in warm salt water twice daily then dry thoroughly.  Keep hands clean and dry.  Please wear gloves for all cooking and cleaning and dirty work.  TheI hope you are feeling better soon! Seek care promptly if your symptoms worsen, new concerns arise or you are not improving with treatment.

## 2017-06-17 ENCOUNTER — Ambulatory Visit
Admission: RE | Admit: 2017-06-17 | Discharge: 2017-06-17 | Disposition: A | Discharge status: Home / Self Care | Source: Ambulatory Visit | Attending: Family Medicine | Admitting: Family Medicine

## 2017-06-17 DIAGNOSIS — Z1231 Encounter for screening mammogram for malignant neoplasm of breast: Secondary | ICD-10-CM

## 2017-07-08 ENCOUNTER — Other Ambulatory Visit: Payer: Self-pay | Admitting: Family Medicine

## 2017-07-16 ENCOUNTER — Encounter: Payer: Self-pay | Admitting: Family Medicine

## 2017-12-31 ENCOUNTER — Ambulatory Visit: Admitting: Family Medicine

## 2018-01-13 ENCOUNTER — Telehealth: Payer: Self-pay | Admitting: Cardiovascular Disease

## 2018-01-13 NOTE — Telephone Encounter (Signed)
Did not need this encounter °

## 2018-04-16 ENCOUNTER — Other Ambulatory Visit: Payer: Self-pay | Admitting: Family Medicine

## 2018-05-17 ENCOUNTER — Other Ambulatory Visit: Payer: Self-pay | Admitting: Family Medicine

## 2018-05-17 DIAGNOSIS — Z1231 Encounter for screening mammogram for malignant neoplasm of breast: Secondary | ICD-10-CM

## 2018-07-05 ENCOUNTER — Ambulatory Visit
Admission: RE | Admit: 2018-07-05 | Discharge: 2018-07-05 | Disposition: A | Discharge status: Home / Self Care | Payer: Medicare Other | Source: Ambulatory Visit | Attending: Family Medicine | Admitting: Family Medicine

## 2018-07-05 DIAGNOSIS — Z1231 Encounter for screening mammogram for malignant neoplasm of breast: Secondary | ICD-10-CM

## 2018-09-12 ENCOUNTER — Other Ambulatory Visit: Payer: Self-pay | Admitting: Family Medicine

## 2019-01-06 ENCOUNTER — Other Ambulatory Visit: Payer: Self-pay | Admitting: Family Medicine

## 2019-01-21 ENCOUNTER — Other Ambulatory Visit: Payer: Self-pay

## 2019-01-21 ENCOUNTER — Telehealth: Payer: Self-pay | Admitting: *Deleted

## 2019-01-21 ENCOUNTER — Ambulatory Visit (INDEPENDENT_AMBULATORY_CARE_PROVIDER_SITE_OTHER): Payer: Medicare Other | Admitting: Family Medicine

## 2019-01-21 ENCOUNTER — Encounter: Payer: Self-pay | Admitting: Family Medicine

## 2019-01-21 DIAGNOSIS — Z13220 Encounter for screening for lipoid disorders: Secondary | ICD-10-CM

## 2019-01-21 DIAGNOSIS — Z8781 Personal history of (healed) traumatic fracture: Secondary | ICD-10-CM

## 2019-01-21 DIAGNOSIS — R5383 Other fatigue: Secondary | ICD-10-CM | POA: Diagnosis not present

## 2019-01-21 DIAGNOSIS — Z862 Personal history of diseases of the blood and blood-forming organs and certain disorders involving the immune mechanism: Secondary | ICD-10-CM

## 2019-01-21 DIAGNOSIS — Z131 Encounter for screening for diabetes mellitus: Secondary | ICD-10-CM | POA: Diagnosis not present

## 2019-01-21 DIAGNOSIS — T148XXA Other injury of unspecified body region, initial encounter: Secondary | ICD-10-CM

## 2019-01-21 DIAGNOSIS — M179 Osteoarthritis of knee, unspecified: Secondary | ICD-10-CM

## 2019-01-21 DIAGNOSIS — E2839 Other primary ovarian failure: Secondary | ICD-10-CM

## 2019-01-21 DIAGNOSIS — M1710 Unilateral primary osteoarthritis, unspecified knee: Secondary | ICD-10-CM

## 2019-01-21 DIAGNOSIS — E039 Hypothyroidism, unspecified: Secondary | ICD-10-CM | POA: Diagnosis not present

## 2019-01-21 DIAGNOSIS — Z96659 Presence of unspecified artificial knee joint: Secondary | ICD-10-CM

## 2019-01-21 DIAGNOSIS — E559 Vitamin D deficiency, unspecified: Secondary | ICD-10-CM

## 2019-01-21 DIAGNOSIS — M2550 Pain in unspecified joint: Secondary | ICD-10-CM

## 2019-01-21 DIAGNOSIS — Z96641 Presence of right artificial hip joint: Secondary | ICD-10-CM

## 2019-01-21 MED ORDER — LEVOTHYROXINE SODIUM 100 MCG PO TABS: ORAL_TABLET | ORAL | 1 refills | Status: DC

## 2019-01-21 NOTE — Telephone Encounter (Signed)
Number added to the appt notes.

## 2019-01-21 NOTE — Telephone Encounter (Signed)
Copied from [place] [id number]. Topic: Appointment Scheduling - Scheduling Inquiry for Clinic >> Jan 21, 2019  9:19 AM Erick Blinks wrote: Reason for CRM: pt called to confirm teleconference appt for today at 2:30 pm. Preferred phone number [phone]. Tried to call office to notify of schedule change

## 2019-01-21 NOTE — Progress Notes (Signed)
Virtual Visit via Video Note  I connected with Joanne Vaughn   on 01/21/19 at  2:30 PM EDT by a video enabled telemedicine application and verified that I am speaking with the correct person using two identifiers.  Location patient: home Location provider:work office Persons participating in the virtual visit: patient, provider  I discussed the limitations of evaluation and management by telemedicine and the availability of in person appointments. The patient expressed understanding and agreed to proceed.   Joanne Vaughn DOB: 04/25/1951 Encounter date: 01/21/2019  This is a 68 y.o. female who presents to establish care. No chief complaint on file.   History of present illness: Only thing going on with her in last few months is extreme fatigue. At times breaks out in sweat without exertion. Some days has to lay down and rest up to 3 times/days. Has always been active. This is very unusual for her. Arthritis pain which she puts up with. This has been going on for several months. Had pulled out of this when she had it before, but got better.   She does take thyroid medication and wasn't able to get refill. Has been out of medication now for a few weeks.   No fevers. No concerns with COVID.   Takes glucosamine, multivitamin.   Fell in 2015 and broke hip and had to have hip replacement; then 3 years later had partial knee replacement. Just feels like she hasn't bounced back from these surgeries.   Always been very active. Gets a lot of exercise taking care of large home. Does walk every morning. Not having to stop with walking due to fatigue. At times gets SOB but not consistent.  Not sleeping well at night. If she sleeps 3-4 hours feels she does great. Other nights is up all night long. This is ongoing since knee surgery.  At dentist went into panic attack because just couldn't lay back. Doesn't lay down well. Sleeps with several pillows under head; but has done this for years.  Had some anxiety at dentist office - couldn't lay back. Just got anxious with starting this.   Has had a few shooting pains in chest. Not consistent, just random. Not from exertion. This has been going on more in last couple of months. Can go up flight of stairs without stopping. If having weak day/weak time of day it is harder, but other days doesn't bother her. Also having left knee pain; feels that left knee is close to needing surgery. Can't make it through cooking meal and cleaning up. No swelling in legs.   Doesn't feel cold or hot sensitive.   Fruit, veggies, not much meat. Does eat some white meat.   Has been told she had chronic fatigue syndrome in past.   Arthritis in lower back, neck, knee. Hard to get up and get moving. Sometimes finger swelling, sometimes knee swelling. Hard to stand. Even knee that had surgery is hurting as bad as other knee.   Gets mammogram yearly  Past Medical History:  Diagnosis Date  . Arthritis   . Breast CA ([place])    sister  . Chicken pox   . Diverticulosis   . Heart murmur    since childhood, benign   Past Surgical History:  Procedure Laterality Date  . ABDOMINAL HYSTERECTOMY     fibroid tumors; later removed ovaries because they were cystic  . BREAST BIOPSY  1973   benign  . FRACTURE SURGERY     Fracture of  L  Hip and L Wrist 2015  . INTRAMEDULLARY (IM) NAIL INTERTROCHANTERIC Left 09/17/2013   Procedure: INTRAMEDULLARY (IM) NAIL INTERTROCHANTRIC;  Surgeon: [name], MD;  Location: [facility];  Service: Orthopedics;  Laterality: Left;  . ovaries removed  1994  . PARTIAL KNEE ARTHROPLASTY Right 10/22/2016   Procedure: RIGHT UNICOMPARTMENTAL KNEE;  Surgeon: Gaynelle Arabian, MD;  Location: [facility];  Service: Orthopedics;  Laterality: Right;   Allergies  Allergen Reactions  . Statins Nausea Only and Other (See Comments)    Weakness and achy  intolerance   No outpatient medications have been marked as taking for the 01/21/19 encounter (Office  Visit) with Caren Macadam, MD.   Social History   Tobacco Use  . Smoking status: Never Smoker  . Smokeless tobacco: Never Used  Substance Use Topics  . Alcohol use: No   Family History  Problem Relation Age of Onset  . Arthritis Other        parent/grandparent  . Heart disease Other        parent/grandparent  . Diabetes Other   . Heart disease Mother 70       MI  . COPD Father 84  . Osteoarthritis Father   . Diabetes Sister   . Breast cancer Sister   . COPD Sister   . Heart disease Maternal Grandmother   . Arthritis Maternal Grandfather   . Rheum arthritis Paternal Grandmother      Review of Systems  Constitutional: Negative for chills, fatigue and fever.  Respiratory: Negative for cough, chest tightness, shortness of breath and wheezing.   Cardiovascular: Negative for chest pain, palpitations and leg swelling.    Objective:  There were no vitals taken for this visit.      BP Readings from Last 3 Encounters:  05/25/17 (!) 118/50  10/23/16 (!) 113/38  10/16/16 (!) 115/49   Wt Readings from Last 3 Encounters:  05/25/17 137 lb 8 oz (62.4 kg)  10/22/16 134 lb (60.8 kg)  10/16/16 134 lb 8 oz (61 kg)    EXAM: No video available. Visit was completed via telephone.  Gen: does not sound in distress Lungs: no difficulty with breathing during conversation.  PSYCH/NEURO: pleasant and cooperative, no obvious depression or anxiety, speech and thought processing grossly intact  Assessment/Plan  1. Hypothyroidism, unspecified type She has been off of her medications now for 2 weeks.  This may be contributing to her extreme fatigue she is feeling.  She has not had blood work in over 2 years.  I have gone ahead and sent in a refill on her Synthroid to take until we can get her blood work back.  It seems that she has felt worse since running out of her medication. - TSH; Future - levothyroxine (SYNTHROID) 100 MCG tablet; TAKE 1 TABLET BY MOUTH ONCE DAILY  Dispense: 90  tablet; Refill: 1  2. Osteoarthritis of knee, unspecified laterality, unspecified osteoarthritis type Ongoing and worsening.  Will need to follow-up with Ortho for this.  3. Other fatigue See above.  Additionally, she has had some chest discomfort.  This is not reliably reproducible with activity.  She has had these symptoms for multiple months, so I do not think it is urgent that they are evaluated today, however we did discuss that it is important for her to have further evaluation to address this.  We also discussed that if symptoms worsen including fatigue, breathing, chest discomfort that she needs to go to the ER. - CBC with Differential/Platelet; Future -  Comprehensive metabolic panel; Future - Vitamin B12; Future - VITAMIN D 25 Hydroxy (Vit-D Deficiency, Fractures); Future - ANA; Future - Sedimentation rate; Future - SAR CoV2 Serology (COVID 19)AB(IGG)IA; Future  4. Screening for diabetes mellitus   5. Lipid screening  - Lipid panel; Future  6. History of anemia  - Vitamin B12; Future  7. Fracture  - VITAMIN D 25 Hydroxy (Vit-D Deficiency, Fractures); Future - DG Bone Density; Future  8. Arthralgia, unspecified joint  - ANA; Future - Sedimentation rate; Future - Rheumatoid factor; Future - C-reactive protein; Future  9. Estrogen deficiency  - DG Bone Density; Future  10. Vitamin D deficiency  - VITAMIN D 25 Hydroxy (Vit-D Deficiency, Fractures); Future    Return needs bloodwork and exam to evaluate current complaint.   I discussed the assessment and treatment plan with the patient. The patient was provided an opportunity to ask questions and all were answered. The patient agreed with the plan and demonstrated an understanding of the instructions.   The patient was advised to call back or seek an in-person evaluation if the symptoms worsen or if the condition fails to improve as anticipated.  I provided 35 minutes of non-face-to-face time during this  encounter.   Micheline Rough, MD

## 2019-01-21 NOTE — Telephone Encounter (Signed)
I called the pt and scheduled appts as below.

## 2019-01-21 NOTE — Telephone Encounter (Signed)
-----   Message from Caren Macadam, MD sent at 01/21/2019  3:17 PM EDT ----- Please schedule bloodwork as soon as possible; then set up in office visit please soon

## 2019-01-24 ENCOUNTER — Other Ambulatory Visit: Payer: Self-pay | Admitting: Family Medicine

## 2019-01-24 DIAGNOSIS — Z1231 Encounter for screening mammogram for malignant neoplasm of breast: Secondary | ICD-10-CM

## 2019-01-25 ENCOUNTER — Other Ambulatory Visit (INDEPENDENT_AMBULATORY_CARE_PROVIDER_SITE_OTHER): Payer: Medicare Other

## 2019-01-25 ENCOUNTER — Other Ambulatory Visit: Payer: Self-pay

## 2019-01-25 DIAGNOSIS — Z862 Personal history of diseases of the blood and blood-forming organs and certain disorders involving the immune mechanism: Secondary | ICD-10-CM

## 2019-01-25 DIAGNOSIS — M2550 Pain in unspecified joint: Secondary | ICD-10-CM

## 2019-01-25 DIAGNOSIS — Z13220 Encounter for screening for lipoid disorders: Secondary | ICD-10-CM

## 2019-01-25 DIAGNOSIS — R5383 Other fatigue: Secondary | ICD-10-CM

## 2019-01-25 DIAGNOSIS — E039 Hypothyroidism, unspecified: Secondary | ICD-10-CM | POA: Diagnosis not present

## 2019-01-25 DIAGNOSIS — E559 Vitamin D deficiency, unspecified: Secondary | ICD-10-CM | POA: Diagnosis not present

## 2019-01-25 DIAGNOSIS — T148XXA Other injury of unspecified body region, initial encounter: Secondary | ICD-10-CM | POA: Diagnosis not present

## 2019-01-25 LAB — CBC WITH DIFFERENTIAL/PLATELET
Basophils Absolute: 0.1 10*3/uL (ref 0.0–0.1)
Basophils Relative: 2.6 % (ref 0.0–3.0)
Eosinophils Absolute: 0.3 10*3/uL (ref 0.0–0.7)
Eosinophils Relative: 6.3 % — ABNORMAL HIGH (ref 0.0–5.0)
HCT: 39.6 % (ref 36.0–46.0)
Hemoglobin: 13.1 g/dL (ref 12.0–15.0)
Lymphocytes Relative: 34.9 % (ref 12.0–46.0)
Lymphs Abs: 1.9 10*3/uL (ref 0.7–4.0)
MCHC: 33.1 g/dL (ref 30.0–36.0)
MCV: 92.6 fl (ref 78.0–100.0)
Monocytes Absolute: 0.6 10*3/uL (ref 0.1–1.0)
Monocytes Relative: 11.3 % (ref 3.0–12.0)
Neutro Abs: 2.4 10*3/uL (ref 1.4–7.7)
Neutrophils Relative %: 44.9 % (ref 43.0–77.0)
Platelets: 204 10*3/uL (ref 150.0–400.0)
RBC: 4.27 Mil/uL (ref 3.87–5.11)
RDW: 12.5 % (ref 11.5–15.5)
WBC: 5.3 10*3/uL (ref 4.0–10.5)

## 2019-01-25 LAB — TSH: TSH: 8.43 u[IU]/mL — ABNORMAL HIGH (ref 0.35–4.50)

## 2019-01-25 LAB — LIPID PANEL
Cholesterol: 229 mg/dL — ABNORMAL HIGH (ref 0–200)
HDL: 46.9 mg/dL (ref >39.00)
LDL Cholesterol: 150 mg/dL — ABNORMAL HIGH (ref 0–99)
NonHDL: 182.44
Total CHOL/HDL Ratio: 5
Triglycerides: 163 mg/dL — ABNORMAL HIGH (ref 0.0–149.0)
VLDL: 32.6 mg/dL (ref 0.0–40.0)

## 2019-01-25 LAB — COMPREHENSIVE METABOLIC PANEL
ALT: 17 U/L (ref 0–35)
AST: 21 U/L (ref 0–37)
Albumin: 4.2 g/dL (ref 3.5–5.2)
Alkaline Phosphatase: 76 U/L (ref 39–117)
BUN: 12 mg/dL (ref 6–23)
CO2: 29 mEq/L (ref 19–32)
Calcium: 8.8 mg/dL (ref 8.4–10.5)
Chloride: 103 mEq/L (ref 96–112)
Creatinine, Ser: 0.79 mg/dL (ref 0.40–1.20)
GFR: 72.68 mL/min (ref >60.00)
Glucose, Bld: 96 mg/dL (ref 70–99)
Potassium: 4.3 mEq/L (ref 3.5–5.1)
Sodium: 140 mEq/L (ref 135–145)
Total Bilirubin: 0.5 mg/dL (ref 0.2–1.2)
Total Protein: 6.8 g/dL (ref 6.0–8.3)

## 2019-01-25 LAB — VITAMIN D 25 HYDROXY (VIT D DEFICIENCY, FRACTURES): VITD: 17 ng/mL — ABNORMAL LOW (ref 30.00–100.00)

## 2019-01-25 LAB — VITAMIN B12: Vitamin B-12: 259 pg/mL (ref 211–911)

## 2019-01-25 LAB — SEDIMENTATION RATE: Sed Rate: 22 mm/hr (ref 0–30)

## 2019-01-25 LAB — C-REACTIVE PROTEIN: CRP: <1 mg/dL (ref 0.5–20.0)

## 2019-01-27 LAB — ANTI-NUCLEAR AB-TITER (ANA TITER): ANA Titer 1: 1:80 {titer} — ABNORMAL HIGH

## 2019-01-27 LAB — ANA: Anti Nuclear Antibody (ANA): POSITIVE — AB

## 2019-01-27 LAB — RHEUMATOID FACTOR: Rheumatoid fact SerPl-aCnc: <14 IU/mL (ref <14)

## 2019-01-27 LAB — SAR COV2 SEROLOGY (COVID19)AB(IGG),IA: SARS CoV2 AB IGG: NEGATIVE

## 2019-02-04 ENCOUNTER — Encounter: Payer: Self-pay | Admitting: Family Medicine

## 2019-02-04 ENCOUNTER — Other Ambulatory Visit: Payer: Self-pay | Admitting: Family Medicine

## 2019-02-04 MED ORDER — VITAMIN D (ERGOCALCIFEROL) 1.25 MG (50000 UNIT) PO CAPS: 50000.0000 [IU] | ORAL_CAPSULE | ORAL | 0 refills | Status: DC

## 2019-02-08 ENCOUNTER — Telehealth: Payer: Medicare Other | Admitting: Family

## 2019-02-08 DIAGNOSIS — R071 Chest pain on breathing: Secondary | ICD-10-CM

## 2019-02-08 NOTE — Progress Notes (Signed)
Based on what you shared with me, I feel your condition warrants further evaluation and I recommend that you be seen for a face to face office visit.  NOTE: If you entered your credit card information for this eVisit, you will not be charged. You may see a "hold" on your card for the $35 but that hold will drop off and you will not have a charge processed.  If you are having a true medical emergency please call 911.     For an urgent face to face visit, [facility] has five urgent care centers for your convenience:    [email or web address] to reserve your spot online an avoid wait times  [facility] (New Address!) [address], [address] [place], [place] [address] *Just off [address], across the road from [employer]* Modified hours of operation: Monday-Friday, 12 PM to 6 PM  Closed Saturday & Sunday   The following sites will take your insurance:  . [facility]    [phone]                  Get Driving Directions  [address] [place], [place] [address] . 10 am to 8 pm Monday-Friday . 12 pm to 8 pm Saturday-Sunday   . [facility]  [phone]                  Get Driving Directions  [address], [address] [place], [place] [address] . 8 am to 8 pm Monday-Friday . 9 am to 6 pm Saturday . 11 am to 6 pm Sunday   . [facility] [place]  [phone]                  Get Driving Directions   [address] [place], [place] [address] . 8 am to 8 pm Monday-Friday . 8 am to 4 pm Saturday-Sunday    . [facility] [place]                    Get Driving Directions  [phone]  [address] [place], [place] [address]  . Monday-Friday, 12 PM to 6 PM    Your e-visit answers were reviewed by a board certified advanced clinical practitioner to complete your personal care plan.  Thank you for using e-Visits. 

## 2019-02-09 ENCOUNTER — Telehealth: Payer: Self-pay | Admitting: *Deleted

## 2019-02-09 NOTE — Telephone Encounter (Signed)
Copied from [place] [id number]. Topic: General - Inquiry >> Feb 09, 2019 12:56 PM Virl Axe D wrote: Reason for CRM: Pt stated she has messaged Dr. Ethlyn Gallery about having an EKG. Pt did an evisit and was advised to contact her PCP. Requesting return call. Please advise.

## 2019-02-10 NOTE — Telephone Encounter (Signed)
I called the pt and informed her of the message below.  Patient stated she has been taking Prilosec for the past few days in the morning and instead will begin taking as instructed below.  Patient declined an appt as this time, prefers to await visit with Dr Ethlyn Gallery as she stated she is not having any chest pain now and will call back if needed.

## 2019-02-10 NOTE — Telephone Encounter (Signed)
She will need an in office visit for this. It would be ok to schedule with another provider since I am not in office next week as long as she is willing. I do think this is reasonable. (if there is an opening on Friday that you see, we can put her there)  I also would suggest based on her message that she try omeprazole over the counter 30 minutes prior to dinner nightly for next few weeks at least and see if this helps. She mentioned sour burps. Sometimes acid reflux will indeed cause chest discomfort, so maybe controlling that acid will help. Might be therapeutic and diagnostic.

## 2019-03-07 ENCOUNTER — Encounter: Payer: Self-pay | Admitting: Family Medicine

## 2019-03-07 ENCOUNTER — Other Ambulatory Visit: Payer: Self-pay

## 2019-03-07 ENCOUNTER — Ambulatory Visit (INDEPENDENT_AMBULATORY_CARE_PROVIDER_SITE_OTHER): Payer: Medicare Other | Admitting: Family Medicine

## 2019-03-07 ENCOUNTER — Telehealth: Payer: Self-pay | Admitting: *Deleted

## 2019-03-07 VITALS — BP 100/60 | HR 66 | Temp 97.2°F | Ht 62.0 in | Wt 139.4 lb

## 2019-03-07 DIAGNOSIS — Z1211 Encounter for screening for malignant neoplasm of colon: Secondary | ICD-10-CM

## 2019-03-07 DIAGNOSIS — F419 Anxiety disorder, unspecified: Secondary | ICD-10-CM

## 2019-03-07 DIAGNOSIS — R002 Palpitations: Secondary | ICD-10-CM | POA: Diagnosis not present

## 2019-03-07 DIAGNOSIS — E039 Hypothyroidism, unspecified: Secondary | ICD-10-CM | POA: Diagnosis not present

## 2019-03-07 DIAGNOSIS — R0602 Shortness of breath: Secondary | ICD-10-CM

## 2019-03-07 LAB — TSH: TSH: 0.36 u[IU]/mL (ref 0.35–4.50)

## 2019-03-07 MED ORDER — CLONAZEPAM 0.5 MG PO TABS: 0.5000 mg | ORAL_TABLET | Freq: Every day | ORAL | 0 refills | Status: DC | PRN

## 2019-03-07 NOTE — Progress Notes (Signed)
Joanne Vaughn DOB: 24-Nov-1950 Encounter date: 03/07/2019  This is a 68 y.o. female who presents with Chief Complaint  Patient presents with  . Follow-up    History of present illness:  Advice from recent bloodwork she completed *ANA mildly positive-this can be indicative of autoimmune disease but her levels are just on border. I would suggest we look at below and then re-visit this level in future and can discuss at upcoming visit (needs scheduled in office visit in next month)  *vitamin d levels are very low. This can affect energy. I would suggest oral replacement. Ok to send in 50,000 units weekly and then she can follow with 07-1998 units daily otc.  *B12 levels are in low normal range. I would suggest supplementing this as well at least for a few months to boost levels/energy. I would recommend 106mcg 3-4 times/week  *thyroid is under-replaced, but now that she has restarted medication this should improve. We can recheck levels in 6-8 weeks time (I can order at next visit)  *cholesterol is a little high, but stable from past. Low cholesterol diet, exercise recommended since statin intolerant.  *liver, kidneys, electrolytes, blood counts, sugar are stable.  HPI  Last time got thyroid medication restarted. On questioning about this states that she may have noted some increase in palpitations with restarting this.   Today having some neck pain, stiffness. Causing her to have headache. This is common for her. Neck pain/stiffness is common. States secondary to long standing arthritis.   Hasn't had as many issues going on in chest. Resting better with less anxiety. Mother has been in ICU after having large MI. She has been going back and forth to [facility] every day. Just doesn't understand anxiety attacks. Can be asleep and it wakes her up. Sometimes wakes her up at 3am and then can't go back to sleep. Symptoms have been going on; off and on for a year. Since having knee replacement hasn't  rested as well or done as well.   Had panic attack at dentist - feels like she can't breathe when she lays back or tips back - feels like she can't breathe when she lays back. If using pillows to help with breathing then neck hurts from positioning with pillows.   Cannot take excedrin any more. When she takes this she gets racing heart. Has cut back on coffee, caffeine. Trying to do decaf all the time.   Notes heart pounding sometimes at bedtime. Mother is in a fib; son just had ablation for a fib. So this is on her mind.   Wondering if there is something she can take when going into anxiety attack.   States that after last time we were talking she was bending over to fix dryer and got severe chest pain. Has had issue with acid reflux in past so wasn't sure if this was related.   Also cares for 67 year old lady.   Tried melatonin which worked just for a day, but not after that. Tried natural under tongue tabs; didn't help much. Just more tired the next day.   Symptoms with heart racing and shortness of breath are not reliable. Sometimes can walk or go up flights of stairs without any difficulty. Other times notes that she feels more short of breath or having runs of rapid heart rate. Happens at least a couple of times/week. Not happening daily. Hasn't progressed since starting, but is worried because it has been present for at least a year and  worse in recent 6 months.   Allergies  Allergen Reactions  . Statins Nausea Only and Other (See Comments)    Weakness and achy  intolerance   Current Meds  Medication Sig  . Vitamin D, Ergocalciferol, (DRISDOL) 1.25 MG (50000 UT) CAPS capsule Take 1 capsule (50,000 Units total) by mouth every 7 (seven) days.  . [DISCONTINUED] levothyroxine (SYNTHROID) 100 MCG tablet TAKE 1 TABLET BY MOUTH ONCE DAILY    Review of Systems  Constitutional: Negative for chills, fatigue and fever.  Respiratory: Negative for cough, chest tightness, shortness of breath  and wheezing.   Cardiovascular: Negative for chest pain, palpitations and leg swelling.    Objective:  BP 100/60 (BP Location: Left Arm, Patient Position: Sitting, Cuff Size: Normal)   Pulse 66   Temp (!) 97.2 F (36.2 C) (Temporal)   Ht 5\' 2"  (1.575 m)   Wt 139 lb 6.4 oz (63.2 kg)   SpO2 99%   BMI 25.50 kg/m   Weight: 139 lb 6.4 oz (63.2 kg)   BP Readings from Last 3 Encounters:  03/07/19 100/60  05/25/17 (!) 118/50  10/23/16 (!) 113/38   Wt Readings from Last 3 Encounters:  03/07/19 139 lb 6.4 oz (63.2 kg)  05/25/17 137 lb 8 oz (62.4 kg)  10/22/16 134 lb (60.8 kg)    Physical Exam Constitutional:      General: She is not in acute distress.    Appearance: She is well-developed.  Cardiovascular:     Rate and Rhythm: Normal rate and regular rhythm.     Heart sounds: Normal heart sounds. No murmur. No friction rub.  Pulmonary:     Effort: Pulmonary effort is normal. No respiratory distress.     Breath sounds: Normal breath sounds. No wheezing or rales.  Abdominal:     General: Abdomen is flat. Bowel sounds are normal. There is no distension.     Palpations: Abdomen is soft. There is no mass.     Tenderness: There is no abdominal tenderness.  Musculoskeletal:     Right lower leg: No edema.     Left lower leg: No edema.  Neurological:     Mental Status: She is alert and oriented to person, place, and time.  Psychiatric:        Mood and Affect: Mood normal.        Behavior: Behavior normal.        Thought Content: Thought content normal.        Judgment: Judgment normal.     Assessment/Plan  1. Palpitations EKG does show PVC but otherwise stable without acute change. We did review this together today in office. I do think it would be reasonable to get an event monitor. We are going to start with CXR and echo for structure. Reviewed foods/drinks that contribute (most of which she is already avoiding). Let me know if worsening in meanwhile. Consider cardiology  referral pending above. - EKG 12-Lead - ECHOCARDIOGRAM COMPLETE; Future - DG Chest 2 View; Future  2. Shortness of breath See above. Low suspicion for PE but since obtaining other bloodwork today will get baseline.  - D-dimer, quantitative (not at [facility]); Future - ECHOCARDIOGRAM COMPLETE; Future - DG Chest 2 View; Future - D-dimer, quantitative (not at [facility])  3. Anxiety Trial of klonopin as we work on figuring out cause of sx. Discussed new medication(s) today with patient. Discussed potential side effects and patient verbalized understanding. Discussed limiting use of klonopin as this is controlled substance. Chose this  since anxiety is not every day occurrence. Increased personal stress at current time. Also looking for something slightly less sedating (so avoiding anti-histamines) - clonazePAM (KLONOPIN) 0.5 MG tablet; Take 1 tablet (0.5 mg total) by mouth daily as needed for anxiety.  Dispense: 20 tablet; Refill: 0  4. Hypothyroidism, unspecified type Will recheck TSH today. It is early since restarting medications, but if large drop in TSH could decrease synthroid dose which may help with palpitations.  - TSH; Future - TSH  5. Screening for colon cancer - Cologuard   Monitor blood pressure.  Return for pending testing results. Micheline Rough, MD

## 2019-03-07 NOTE — Patient Instructions (Signed)
Consider topical voltaren gel for arthritis.   For joints can consider: glucosamine supplement; turmeric; tart cherry juice

## 2019-03-07 NOTE — Telephone Encounter (Signed)
Signed order for Cologuard faxed to [employer] at [phone].

## 2019-03-08 ENCOUNTER — Other Ambulatory Visit: Payer: Self-pay | Admitting: Family Medicine

## 2019-03-08 DIAGNOSIS — E039 Hypothyroidism, unspecified: Secondary | ICD-10-CM

## 2019-03-08 LAB — D-DIMER, QUANTITATIVE: D-Dimer, Quant: 0.59 mcg/mL FEU — ABNORMAL HIGH (ref <0.50)

## 2019-03-08 MED ORDER — LEVOTHYROXINE SODIUM 100 MCG PO TABS: 50.0000 ug | ORAL_TABLET | Freq: Every day | ORAL | 1 refills | Status: DC

## 2019-03-09 ENCOUNTER — Telehealth: Payer: Self-pay | Admitting: Radiology

## 2019-03-09 NOTE — Telephone Encounter (Signed)
Enrolled patient for a 30 Day [employer] event monitor to be mailed. Brief instructions were gone over with the patient and she knows to expect the monitor to arrive in 3-4 days 

## 2019-03-16 ENCOUNTER — Other Ambulatory Visit: Payer: Self-pay

## 2019-03-16 ENCOUNTER — Ambulatory Visit (HOSPITAL_COMMUNITY): Payer: Medicare Other | Attending: Cardiology

## 2019-03-16 ENCOUNTER — Ambulatory Visit (INDEPENDENT_AMBULATORY_CARE_PROVIDER_SITE_OTHER): Payer: Medicare Other

## 2019-03-16 DIAGNOSIS — R002 Palpitations: Secondary | ICD-10-CM | POA: Diagnosis not present

## 2019-03-16 DIAGNOSIS — R0602 Shortness of breath: Secondary | ICD-10-CM | POA: Diagnosis not present

## 2019-03-23 LAB — COLOGUARD: Cologuard: NEGATIVE

## 2019-03-24 ENCOUNTER — Encounter: Payer: Self-pay | Admitting: Family Medicine

## 2019-04-13 ENCOUNTER — Telehealth: Payer: Self-pay | Admitting: Family Medicine

## 2019-04-13 NOTE — Telephone Encounter (Signed)
Copied from [place] [phone]. Topic: General - Other >> Apr 13, 2019  9:53 AM Keene Breath wrote: Reason for CRM: Patient called to inform the doctor that she still has not heard from the heart specialist.  It has been over a month.  Please advise and call to discuss.  CB# [phone]

## 2019-04-14 NOTE — Telephone Encounter (Signed)
Patients husband asked about the referral as he was calling for cough syrup. I advised Mr Hamed it can take some time for the appt to be scheduled and gave him the number to call [facility] to see if they have had any cancellations.  Message also sent to [facility] with this info.

## 2019-04-14 NOTE — Telephone Encounter (Signed)
Pt is aware of scheduled appt  05/05/2019@3 :00 in the [place] office - pt aware of her appt  Spoke with pt

## 2019-05-05 ENCOUNTER — Encounter: Payer: Self-pay | Admitting: Cardiology

## 2019-05-05 ENCOUNTER — Ambulatory Visit (INDEPENDENT_AMBULATORY_CARE_PROVIDER_SITE_OTHER): Payer: Medicare Other | Admitting: Cardiology

## 2019-05-05 ENCOUNTER — Other Ambulatory Visit: Payer: Self-pay

## 2019-05-05 VITALS — BP 113/66 | HR 65 | Ht 62.0 in | Wt 142.2 lb

## 2019-05-05 DIAGNOSIS — I493 Ventricular premature depolarization: Secondary | ICD-10-CM | POA: Diagnosis not present

## 2019-05-05 NOTE — Progress Notes (Signed)
Cardiology Office Note  Date: 05/05/2019   ID: Joanne Vaughn, DOB 11/26/1950, MRN [id number]  PCP:  Caren Macadam, MD  Consulting Cardiologist:  Rozann Lesches, MD Electrophysiologist:  None   Chief Complaint  Patient presents with  . Palpitations    History of Present Illness: Joanne Vaughn is a 68 y.o. female referred for cardiology consultation by Dr. Ethlyn Gallery for the evaluation of palpitations.  She reports a general sense of skipping heartbeat or brief flutter, lasting only a few seconds.  She had noticed this a few months ago, also a feeling of relative shortness of breath.  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  She does seem to be experiencing caregiver stress.  She does not report any unusual degree of caffeine intake.  Exercise is limited by knee pain.  Interestingly, her TSH was low end of normal range and since having Synthroid dose cut back, her symptoms have improved a fair bit.  I see that she has already undergone an echocardiogram and a 30-day event recorder as reviewed below.  I reviewed the results and went over the cardiac monitor strips myself.  Heart rate ranged from 40 bpm up to 125 bpm, with average heart rate 65 bpm.  Bradycardia occurred during early morning hours, presumably while asleep.  Rare to occasional PVCs noted (2% of total beats) with brief NSVT of 4 beats.  Today we talked about her study results, overall echocardiogram was reassuring with only mild diastolic dysfunction which would not be unexpected at age 68.  Her blood pressure is normal and she does not report any obvious symptoms to suggest fluid overload.  With relative infrequency of her PVCs and no sustained arrhythmias, I would not necessarily suggest any specific medical therapy.  We talked about basic lifestyle modification.  Past Medical History:  Diagnosis Date  . Arthritis   . Chicken pox   .  Diverticulosis   . Heart murmur     Past Surgical History:  Procedure Laterality Date  . ABDOMINAL HYSTERECTOMY     fibroid tumors; later removed ovaries because they were cystic  . BREAST BIOPSY  1973   benign  . FRACTURE SURGERY     Fracture of  L Hip and L Wrist 2015  . INTRAMEDULLARY (IM) NAIL INTERTROCHANTERIC Left 09/17/2013   Procedure: INTRAMEDULLARY (IM) NAIL INTERTROCHANTRIC;  Surgeon: [name], MD;  Location: [facility];  Service: Orthopedics;  Laterality: Left;  . ovaries removed  1994  . PARTIAL KNEE ARTHROPLASTY Right 10/22/2016   Procedure: RIGHT UNICOMPARTMENTAL KNEE;  Surgeon: Gaynelle Arabian, MD;  Location: [facility];  Service: Orthopedics;  Laterality: Right;    Current Outpatient Medications  Medication Sig Dispense Refill  . clonazePAM (KLONOPIN) 0.5 MG tablet Take 1 tablet (0.5 mg total) by mouth daily as needed for anxiety. 20 tablet 0  . levothyroxine (SYNTHROID) 100 MCG tablet Take 0.5 tablets (50 mcg total) by mouth daily before breakfast. TAKE 1 TABLET BY MOUTH ONCE DAILY 90 tablet 1  . Vitamin D, Ergocalciferol, (DRISDOL) 1.25 MG (50000 UT) CAPS capsule Take 1 capsule (50,000 Units total) by mouth every 7 (seven) days. 12 capsule 0   No current facility-administered medications for this visit.    Allergies:  Statins   Social History: The patient  reports that she has never smoked. She has never used smokeless tobacco. She reports that she does not drink alcohol or use  drugs.   Family History: The patient's family history includes Arthritis in her maternal grandfather and another family member; Breast cancer in her sister; COPD in her sister; COPD (age of onset: 22) in her father; Diabetes in her sister and another family member; Heart disease in her maternal grandmother and another family member; Heart disease (age of onset: 38) in her mother; Osteoarthritis in her father; Rheum arthritis in her paternal grandmother.   ROS:  Please see the history of present  illness. Otherwise, complete review of systems is positive for none.  All other systems are reviewed and negative.   Physical Exam: VS:  BP 113/66   Pulse 65   Ht 5\' 2"  (1.575 m)   Wt 142 lb 3.2 oz (64.5 kg)   SpO2 99%   BMI 26.01 kg/m , BMI Body mass index is 26.01 kg/m.  Wt Readings from Last 3 Encounters:  05/05/19 142 lb 3.2 oz (64.5 kg)  03/07/19 139 lb 6.4 oz (63.2 kg)  05/25/17 137 lb 8 oz (62.4 kg)    General: Patient appears comfortable at rest. HEENT: Conjunctiva and lids normal, wearing a mask. Neck: Supple, no elevated JVP or carotid bruits, no thyromegaly. Lungs: Clear to auscultation, nonlabored breathing at rest. Cardiac: Regular rate and rhythm, no S3, soft systolic murmur. Abdomen: Soft, nontender, bowel sounds present. Extremities: No pitting edema, distal pulses 2+. Skin: Warm and dry. Musculoskeletal: No kyphosis. Neuropsychiatric: Alert and oriented x3, affect grossly appropriate.  ECG:  An ECG dated 03/07/2019 was personally reviewed today and demonstrated:  Sinus rhythm with PVC.  Recent Labwork: 01/25/2019: ALT 17; AST 21; BUN 12; Creatinine, Ser 0.79; Hemoglobin 13.1; Platelets 204.0; Potassium 4.3; Sodium 140 03/07/2019: TSH 0.36     Component Value Date/Time   CHOL 229 (H) 01/25/2019 0833   TRIG 163.0 (H) 01/25/2019 0833   HDL 46.90 01/25/2019 0833   CHOLHDL 5 01/25/2019 0833   VLDL 32.6 01/25/2019 0833   LDLCALC 150 (H) 01/25/2019 0833   LDLDIRECT 123.0 09/17/2016 0916    Other Studies Reviewed Today:  Echocardiogram 03/16/2019:  1. The average left ventricular global longitudinal strain is -23.7 %.  2. The left ventricle has normal systolic function with an ejection fraction of 60-65%. The cavity size was normal. Left ventricular diastolic Doppler parameters are consistent with impaired relaxation. No evidence of left ventricular regional wall  motion abnormalities.  3. The right ventricle has normal systolic function. The cavity was normal.  There is no increase in right ventricular wall thickness. Right ventricular systolic pressure is normal with an estimated pressure of 17.3 mmHg.  4. The aorta is normal unless otherwise noted.  30-day event monitor September 2020:  Normal sinus rhythm with occasional PVCs.  Short run of nonsustained VTach, lasting just seconds. No associated symptoms.  No sustained pathologic arrhythmias.  Assessment and Plan:  1.  Rare to occasional PVCs as noted above.  She was reporting a sense of palpitations and heart skips, although this has improved with reduction in Synthroid dose and prior TSH low normal range.  With normal LVEF, no sudden syncope or exertional chest pain, I would recommend basic lifestyle modification, specifically avoiding caffeine and other stimulants, trying to increase aerobic activity, reducing stress.  2.  Echocardiogram showing mildly impaired relaxation, not unusual at age 30.  She does not have obvious hypertension by history or evidence of fluid overload.  Would recommend exercise.  Of note, the global longitudinal strain was normal.  Medication Adjustments/Labs and Tests Ordered: Current medicines  are reviewed at length with the patient today.  Concerns regarding medicines are outlined above.   Tests Ordered: No orders of the defined types were placed in this encounter.   Medication Changes: No orders of the defined types were placed in this encounter.   Disposition:  Follow up with PCP.  We can see her back as needed.  Signed, Satira Sark, MD, [facility] 05/05/2019 3:09 PM    [facility] [place], [place], [place] [address] Phone: [phone]; Fax: [phone]

## 2019-05-05 NOTE — Patient Instructions (Addendum)
Medication Instructions:   Your physician recommends that you continue on your current medications as directed. Please refer to the Current Medication list given to you today.  Labwork:  NONE  Testing/Procedures:  NONE  Follow-Up:  Your physician recommends that you schedule a follow-up appointment in: as needed.   Any Other Special Instructions Will Be Listed Below (If Applicable).  If you need a refill on your cardiac medications before your next appointment, please call your pharmacy. 

## 2019-06-17 ENCOUNTER — Other Ambulatory Visit: Payer: Medicare Other

## 2019-06-17 ENCOUNTER — Other Ambulatory Visit: Payer: Self-pay

## 2019-06-17 DIAGNOSIS — E039 Hypothyroidism, unspecified: Secondary | ICD-10-CM

## 2019-06-17 NOTE — Addendum Note (Signed)
Addended by: Suzette Battiest on: 06/17/2019 11:55 AM   Modules accepted: Orders

## 2019-06-18 LAB — TSH: TSH: 6.13 mIU/L — ABNORMAL HIGH (ref 0.40–4.50)

## 2019-06-22 ENCOUNTER — Other Ambulatory Visit: Payer: Self-pay | Admitting: Family Medicine

## 2019-06-22 DIAGNOSIS — E039 Hypothyroidism, unspecified: Secondary | ICD-10-CM

## 2019-06-22 MED ORDER — LEVOTHYROXINE SODIUM 100 MCG PO TABS: ORAL_TABLET | ORAL | 1 refills | Status: DC

## 2019-06-23 ENCOUNTER — Other Ambulatory Visit: Payer: Self-pay | Admitting: *Deleted

## 2019-06-23 DIAGNOSIS — E039 Hypothyroidism, unspecified: Secondary | ICD-10-CM

## 2019-07-11 ENCOUNTER — Ambulatory Visit
Admission: RE | Admit: 2019-07-11 | Discharge: 2019-07-11 | Disposition: A | Discharge status: Home / Self Care | Payer: Medicare Other | Source: Ambulatory Visit | Attending: Family Medicine | Admitting: Family Medicine

## 2019-07-11 ENCOUNTER — Other Ambulatory Visit: Payer: Self-pay

## 2019-07-11 DIAGNOSIS — Z1231 Encounter for screening mammogram for malignant neoplasm of breast: Secondary | ICD-10-CM

## 2019-07-11 DIAGNOSIS — T148XXA Other injury of unspecified body region, initial encounter: Secondary | ICD-10-CM

## 2019-07-11 DIAGNOSIS — E2839 Other primary ovarian failure: Secondary | ICD-10-CM

## 2019-07-18 ENCOUNTER — Encounter: Payer: Self-pay | Admitting: Family Medicine

## 2019-07-18 DIAGNOSIS — M810 Age-related osteoporosis without current pathological fracture: Secondary | ICD-10-CM | POA: Insufficient documentation

## 2019-07-19 ENCOUNTER — Other Ambulatory Visit: Payer: Self-pay | Admitting: *Deleted

## 2019-07-19 MED ORDER — ALENDRONATE SODIUM 70 MG PO TABS: 70.0000 mg | ORAL_TABLET | ORAL | 1 refills | Status: DC

## 2019-07-21 ENCOUNTER — Telehealth: Payer: Self-pay | Admitting: *Deleted

## 2019-07-21 NOTE — Telephone Encounter (Signed)
-----   Message from Caren Macadam, MD sent at 07/16/2019 12:15 PM EST ----- Was cologuard received? I don't see results; just checking in. Please resend order if not received.  ----- Message ----- From: SYSTEM Sent: 03/12/2019  12:04 AM EST To: Caren Macadam, MD

## 2019-07-21 NOTE — Telephone Encounter (Signed)
See abstraction.

## 2019-08-26 ENCOUNTER — Ambulatory Visit: Payer: Medicare Other | Admitting: Family Medicine

## 2019-08-26 ENCOUNTER — Other Ambulatory Visit: Payer: Medicare Other

## 2019-09-17 ENCOUNTER — Other Ambulatory Visit: Payer: Self-pay | Admitting: Family Medicine

## 2019-09-17 DIAGNOSIS — E039 Hypothyroidism, unspecified: Secondary | ICD-10-CM

## 2019-09-19 ENCOUNTER — Telehealth: Payer: Self-pay | Admitting: Family Medicine

## 2019-09-19 NOTE — Telephone Encounter (Signed)
Ok; just make patient aware.  

## 2019-09-19 NOTE — Telephone Encounter (Signed)
Angie from [facility] would like a verbal ok to change levothyroxine to a different manufacture. Please cal back and confirm with pharmacist.

## 2019-09-19 NOTE — Telephone Encounter (Signed)
Spoke with Angie and informed her of the message below.

## 2019-10-13 ENCOUNTER — Other Ambulatory Visit: Payer: Self-pay

## 2019-10-14 ENCOUNTER — Other Ambulatory Visit: Payer: Medicare Other

## 2019-10-14 ENCOUNTER — Encounter: Payer: Self-pay | Admitting: Family Medicine

## 2019-10-14 ENCOUNTER — Ambulatory Visit (INDEPENDENT_AMBULATORY_CARE_PROVIDER_SITE_OTHER): Payer: Medicare Other | Admitting: Family Medicine

## 2019-10-14 DIAGNOSIS — E559 Vitamin D deficiency, unspecified: Secondary | ICD-10-CM

## 2019-10-14 DIAGNOSIS — M810 Age-related osteoporosis without current pathological fracture: Secondary | ICD-10-CM

## 2019-10-14 DIAGNOSIS — E039 Hypothyroidism, unspecified: Secondary | ICD-10-CM

## 2019-10-14 DIAGNOSIS — E538 Deficiency of other specified B group vitamins: Secondary | ICD-10-CM

## 2019-10-14 MED ORDER — MELOXICAM 7.5 MG PO TABS: 7.5000 mg | ORAL_TABLET | Freq: Every day | ORAL | 1 refills | Status: DC

## 2019-10-14 MED ORDER — SHINGRIX 50 MCG/0.5ML IM SUSR: 0.5000 mL | Freq: Once | INTRAMUSCULAR | 0 refills | Status: AC

## 2019-10-14 NOTE — Patient Instructions (Addendum)
Let me know if the melatonin and benadryl do not work for you for sleep.   Consider voltaren gel for neck/arthritis pain  Consider tart cherry juice  Consider turmeric Consider glucosamine

## 2019-10-14 NOTE — Progress Notes (Signed)
Joanne Vaughn DOB: [birth year] Encounter date: 10/14/2019  This is a 69 y.o. female who presents with Chief Complaint  Patient presents with  . Follow-up    complains of recurrent fatigue    History of present illness: Saw cardiology for palpitations; stable from cardiac standpoint; recommended regular exercise and bp monitoring. Feels that palpitations are better. Still just not sleeping well at night which makes her very fatigued next day. Trouble falling and staying asleep. Thinks this has been an issue since hip surgery - about 5 years ago. Melatonin worked briefly and then "nothing". Nothing that wakes her regularly. Does have a lot of arthritis in left knee and it can wake her up. Also has neck pain which makes it hard to get comfortable. No snoring.   Osteoporosis: on fosamax   Hypothyroid: on synthroid; due for recheck TSH  cologuard 03/2019 negative  Doesn't feel comfortable with COVID vaccine. Wondering about ivermectin as prevention.     Allergies  Allergen Reactions  . Statins Nausea Only and Other (See Comments)    Weakness and achy  intolerance   Current Meds  Medication Sig  . clonazePAM (KLONOPIN) 0.5 MG tablet Take 1 tablet (0.5 mg total) by mouth daily as needed for anxiety.  Marland Kitchen levothyroxine (SYNTHROID) 100 MCG tablet Take 1 tablet by mouth once daily (Patient taking differently: Take 0.5 tablet by mouth 5 days a week,1 tablet two days a week)  . OVER THE COUNTER MEDICATION Vitamin D3 and K2  . [DISCONTINUED] alendronate (FOSAMAX) 70 MG tablet Take 1 tablet (70 mg total) by mouth every 7 (seven) days. Take with a full glass of water on an empty stomach.  . [DISCONTINUED] Vitamin D, Ergocalciferol, (DRISDOL) 1.25 MG (50000 UT) CAPS capsule Take 1 capsule (50,000 Units total) by mouth every 7 (seven) days.    Review of Systems  Constitutional: Negative for chills, fatigue and fever.  Respiratory: Negative for cough, chest tightness, shortness of breath and  wheezing.   Cardiovascular: Negative for chest pain, palpitations and leg swelling.  Musculoskeletal: Positive for arthralgias, neck pain and neck stiffness.  Neurological: Positive for numbness (at night - both hands when trying to sleep. sometimes with driving as well. no weakness or pain).    Objective:  BP (!) 100/50 (BP Location: Left Arm, Patient Position: Sitting, Cuff Size: Normal)   Pulse 66   Temp 98.2 F (36.8 C) (Temporal)   Ht 5\' 2"  (1.575 m)   Wt 144 lb (65.3 kg)   BMI 26.34 kg/m   Weight: 144 lb (65.3 kg)   BP Readings from Last 3 Encounters:  10/14/19 (!) 100/50  05/05/19 113/66  03/07/19 100/60   Wt Readings from Last 3 Encounters:  10/14/19 144 lb (65.3 kg)  05/05/19 142 lb 3.2 oz (64.5 kg)  03/07/19 139 lb 6.4 oz (63.2 kg)    Physical Exam Constitutional:      General: She is not in acute distress.    Appearance: She is well-developed.  Cardiovascular:     Rate and Rhythm: Normal rate and regular rhythm.     Heart sounds: Normal heart sounds. No murmur. No friction rub.  Pulmonary:     Effort: Pulmonary effort is normal. No respiratory distress.     Breath sounds: Normal breath sounds. No wheezing or rales.  Musculoskeletal:     Right lower leg: No edema.     Left lower leg: No edema.  Neurological:     Mental Status: She is alert and oriented to  person, place, and time.     Deep Tendon Reflexes:     Reflex Scores:      Tricep reflexes are 2+ on the right side and 2+ on the left side.      Bicep reflexes are 2+ on the right side and 2+ on the left side.      Brachioradialis reflexes are 2+ on the right side and 2+ on the left side.    Comments: + phalens left hand  Psychiatric:        Behavior: Behavior normal.     Assessment/Plan  1. Vitamin D deficiency Taking replacement therapy  2. B12 deficiency Taking replacement.  3. Osteoporosis without current pathological fracture, unspecified osteoporosis type On fosamax, vitamin d  replacement  4. Hypothyroidism, unspecified type tsh has been stable on current dosing - TSH   Return in about 6 months (around 04/14/2020).     Micheline Rough, MD

## 2019-10-15 LAB — TSH: TSH: 2.95 mIU/L (ref 0.40–4.50)

## 2019-10-16 ENCOUNTER — Encounter: Payer: Self-pay | Admitting: Family Medicine

## 2019-10-18 ENCOUNTER — Telehealth: Payer: Self-pay | Admitting: *Deleted

## 2019-10-18 NOTE — Telephone Encounter (Signed)
Spoke with the pt and she stated she discontinued Fosamax due to problems with her stomach as she has this often and cannot tolerate medications.  Also stated Meloxicam made her stomach upset also, despite taking this with food and she discontinued this also.  Patient stated she will have the pneumonia vaccine at her next visit and will call for an appt.  Message sent to PCP.

## 2019-10-18 NOTE — Telephone Encounter (Signed)
-----   Message from Caren Macadam, MD sent at 10/16/2019 12:08 PM EDT ----- #is she still taking fosamax? Fell off list#we forgot to give pneumonia vaccine. I had ordered but we got busy talking about other things and I forgot to tell you. Just let her know she could do nurse visit for this if desired; or we can get it when she is in next. Sorry!

## 2019-10-19 ENCOUNTER — Encounter: Payer: Self-pay | Admitting: Family Medicine

## 2019-10-19 ENCOUNTER — Other Ambulatory Visit: Payer: Self-pay | Admitting: Family Medicine

## 2019-10-19 NOTE — Telephone Encounter (Signed)
Noted  

## 2020-01-23 IMAGING — MG DIGITAL SCREENING BILAT W/ TOMO W/ CAD
8 series · 9 of 24 positions shown · non-contrast
Comparison: Previous exam(s).

CLINICAL DATA: Screening.

EXAM:
DIGITAL SCREENING BILATERAL MAMMOGRAM WITH TOMO AND CAD

[L MLO synth-2D]
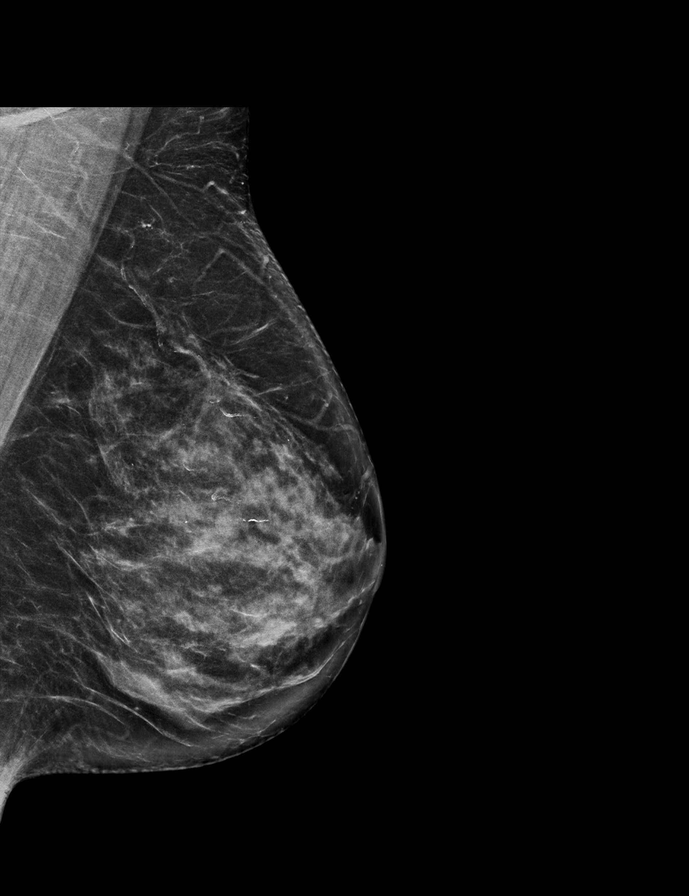

[L CC synth-2D]
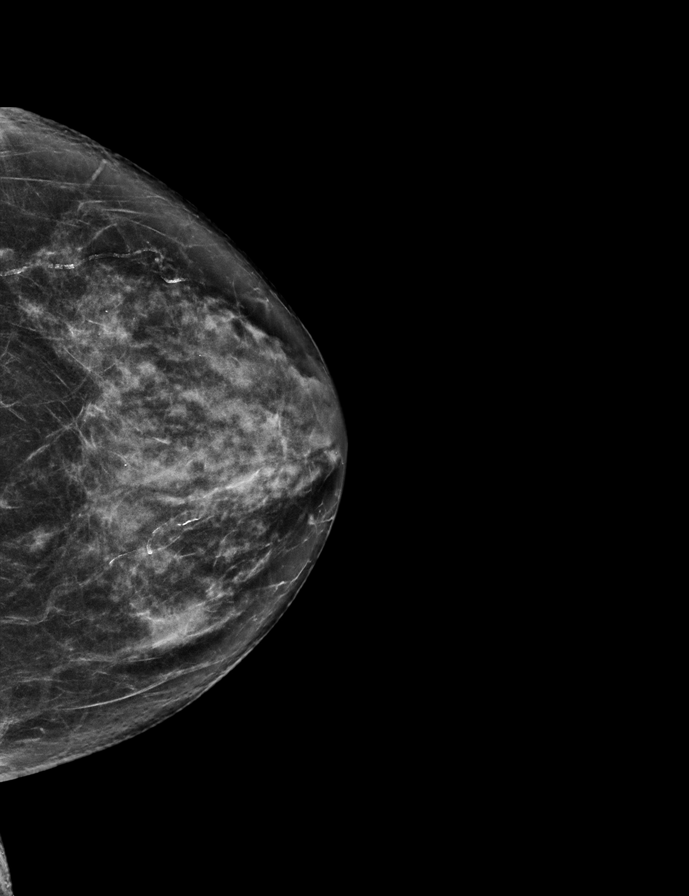

[R CC synth-2D]
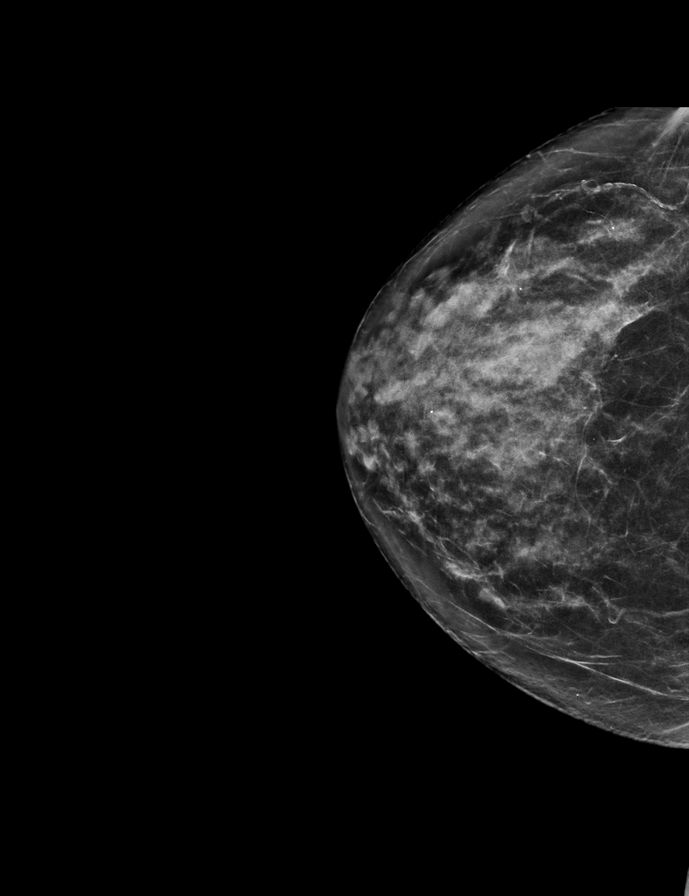

[R MLO synth-2D]
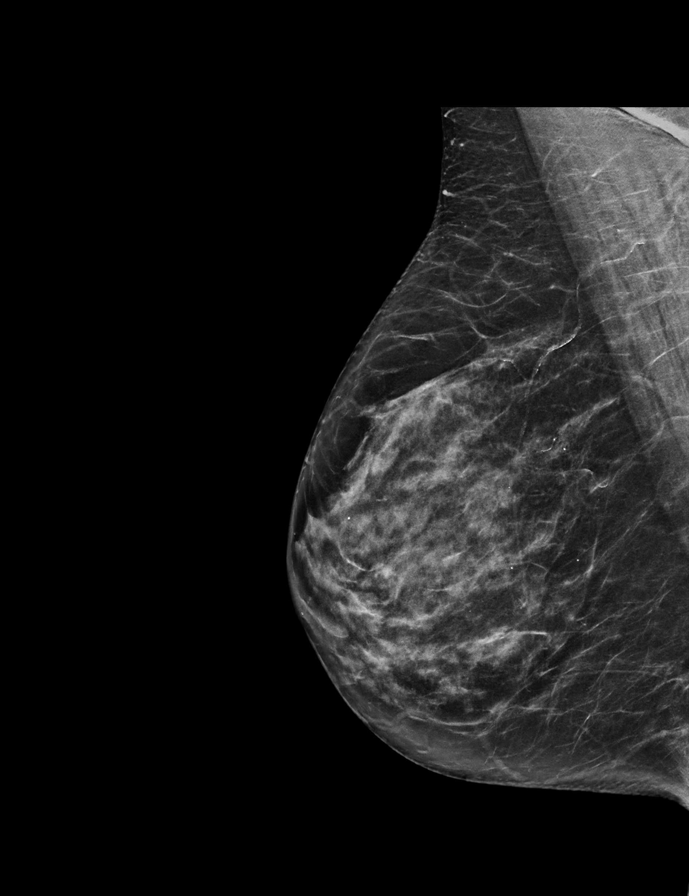

[R CC tomo · 2 of 63 frames shown]
[frame 21/63]
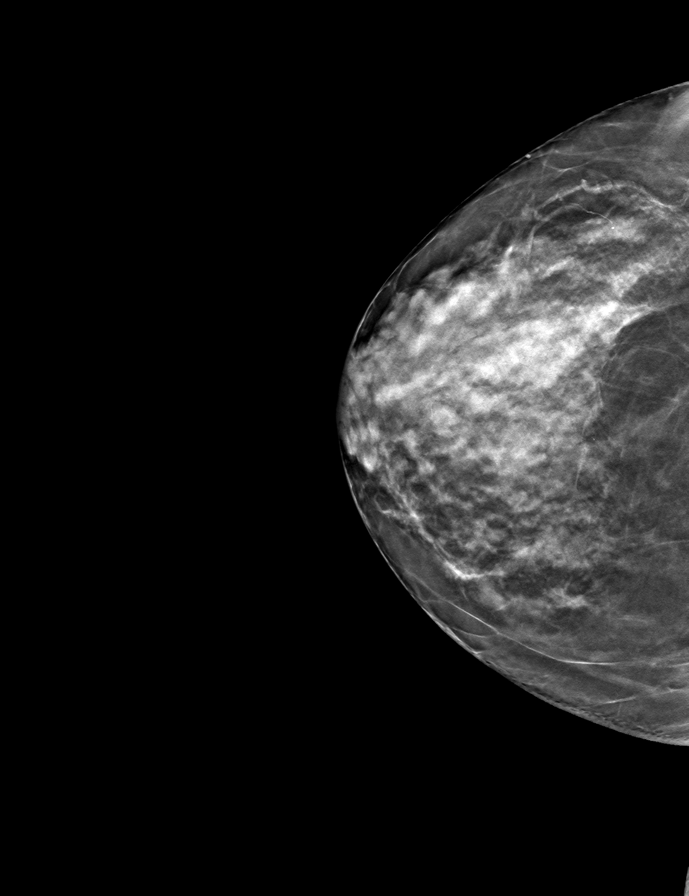
[frame 32/63]
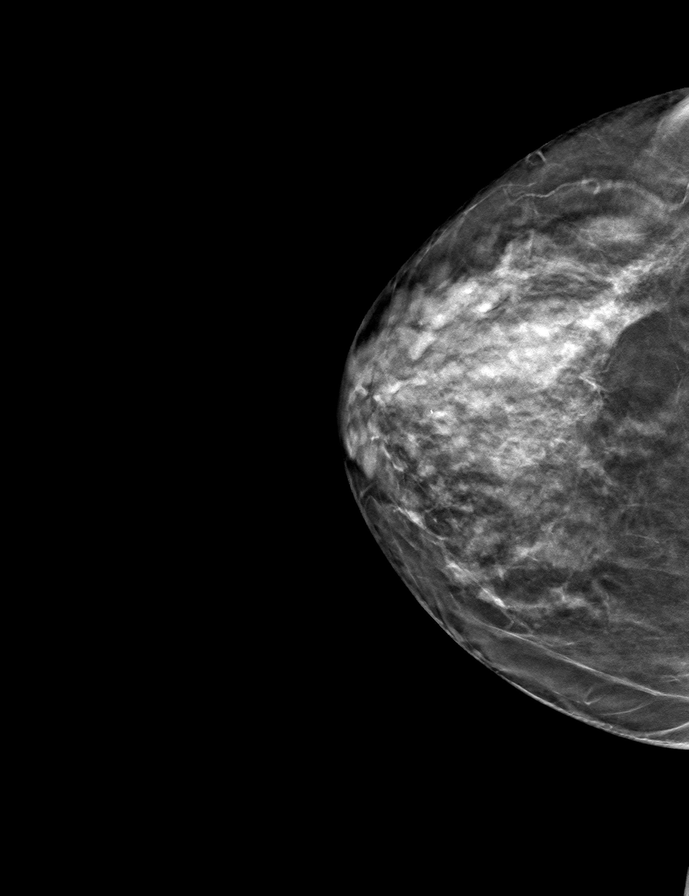

[L MLO tomo · tomo slice 35/68.0]
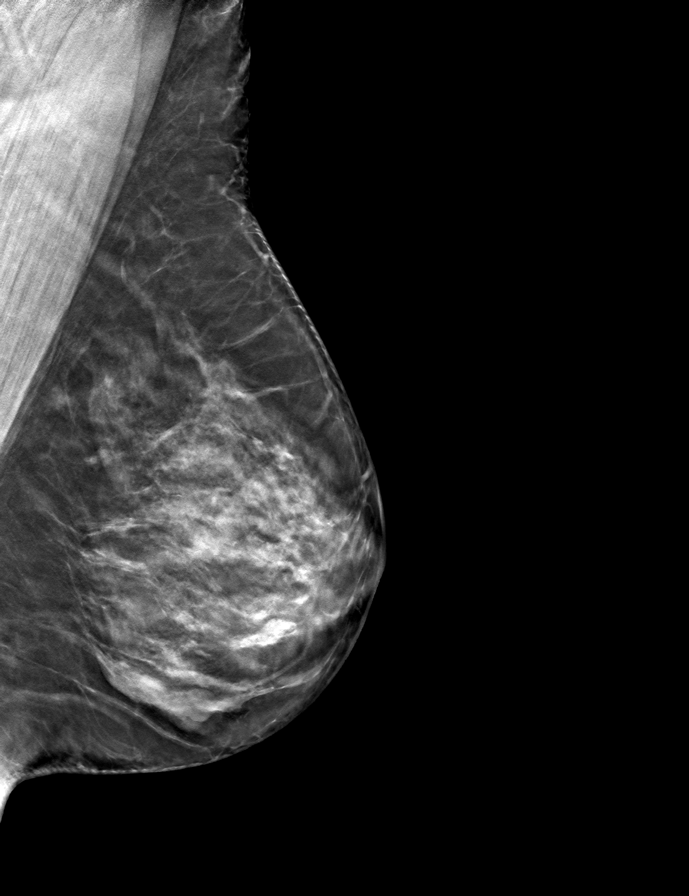

[R MLO tomo · tomo slice 31/62.0]
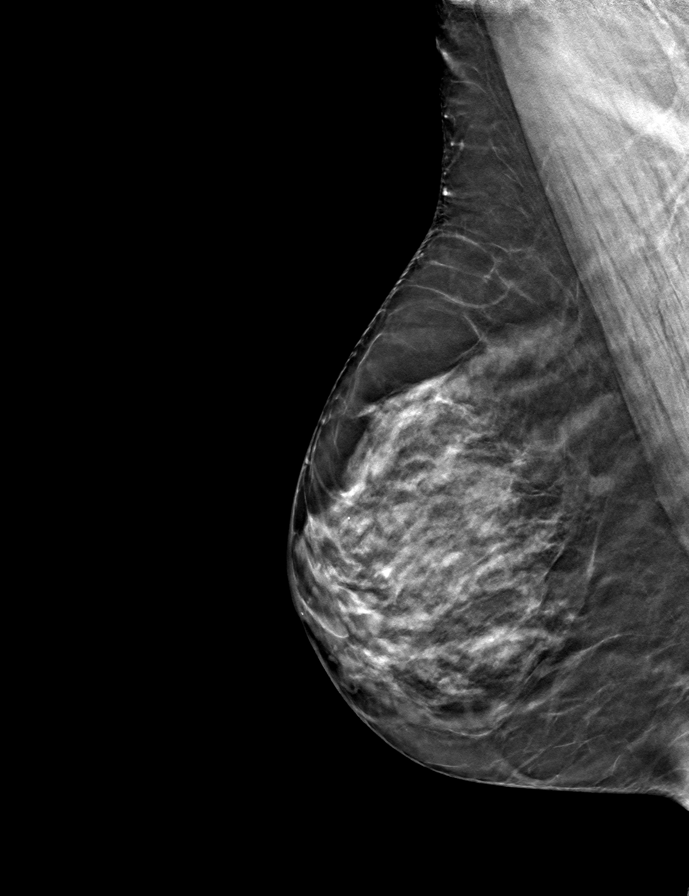

[L CC tomo · tomo slice 31/62.0]
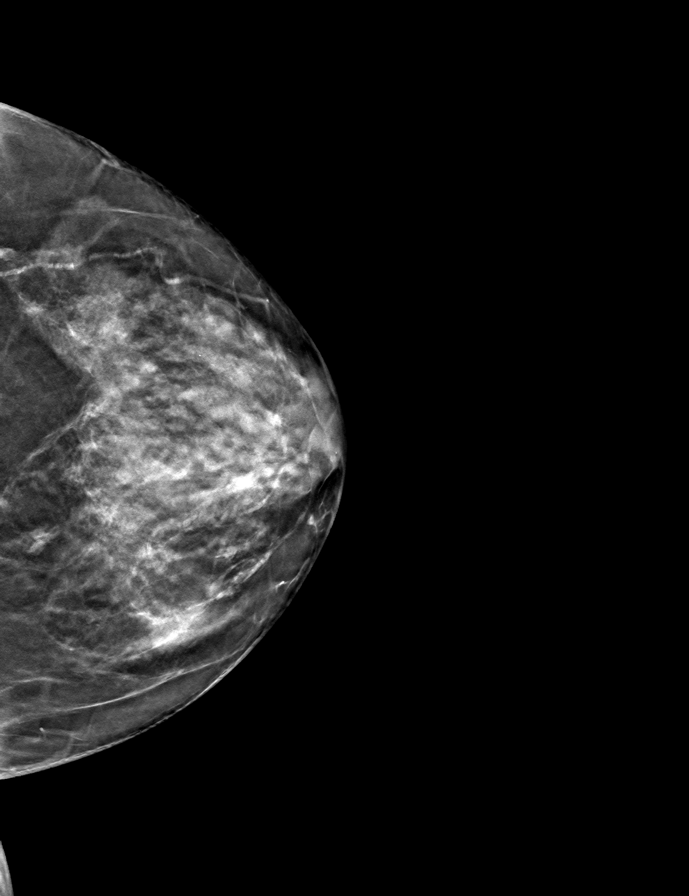

[9 of 24 positions shown; findings below may reference images not displayed]

ACR Breast Density Category c: The breast tissue is heterogeneously
dense, which may obscure small masses.
FINDINGS: There are no findings suspicious for malignancy. Images were
processed with CAD.
IMPRESSION: No mammographic evidence of malignancy. A result letter of this
screening mammogram will be mailed directly to the patient.

RECOMMENDATION:
Screening mammogram in one year. (Code:FT-U-LHB)

BI-RADS CATEGORY  1: Negative.

## 2020-01-27 ENCOUNTER — Encounter: Payer: Self-pay | Admitting: Family Medicine

## 2020-01-27 DIAGNOSIS — E039 Hypothyroidism, unspecified: Secondary | ICD-10-CM

## 2020-01-27 DIAGNOSIS — E559 Vitamin D deficiency, unspecified: Secondary | ICD-10-CM

## 2020-01-27 DIAGNOSIS — R5383 Other fatigue: Secondary | ICD-10-CM

## 2020-01-27 DIAGNOSIS — M810 Age-related osteoporosis without current pathological fracture: Secondary | ICD-10-CM

## 2020-01-27 DIAGNOSIS — E538 Deficiency of other specified B group vitamins: Secondary | ICD-10-CM

## 2020-01-27 DIAGNOSIS — Z13220 Encounter for screening for lipoid disorders: Secondary | ICD-10-CM

## 2020-01-27 DIAGNOSIS — R768 Other specified abnormal immunological findings in serum: Secondary | ICD-10-CM

## 2020-01-28 ENCOUNTER — Other Ambulatory Visit: Payer: Self-pay | Admitting: Family Medicine

## 2020-01-28 DIAGNOSIS — E039 Hypothyroidism, unspecified: Secondary | ICD-10-CM

## 2020-01-31 NOTE — Addendum Note (Signed)
Addended by: Marrion Coy on: 01/31/2020 10:37 AM   Modules accepted: Orders

## 2020-02-01 ENCOUNTER — Other Ambulatory Visit: Payer: Self-pay

## 2020-02-01 ENCOUNTER — Other Ambulatory Visit: Payer: Medicare Other

## 2020-02-01 DIAGNOSIS — E039 Hypothyroidism, unspecified: Secondary | ICD-10-CM

## 2020-02-01 DIAGNOSIS — E559 Vitamin D deficiency, unspecified: Secondary | ICD-10-CM

## 2020-02-01 DIAGNOSIS — E538 Deficiency of other specified B group vitamins: Secondary | ICD-10-CM

## 2020-02-01 DIAGNOSIS — Z13220 Encounter for screening for lipoid disorders: Secondary | ICD-10-CM

## 2020-02-01 DIAGNOSIS — R5383 Other fatigue: Secondary | ICD-10-CM

## 2020-02-01 DIAGNOSIS — R768 Other specified abnormal immunological findings in serum: Secondary | ICD-10-CM

## 2020-02-03 ENCOUNTER — Other Ambulatory Visit: Payer: Self-pay | Admitting: Family Medicine

## 2020-02-05 LAB — CBC WITH DIFFERENTIAL/PLATELET
Absolute Monocytes: 643 cells/uL (ref 200–950)
Basophils Absolute: 112 cells/uL (ref 0–200)
Basophils Relative: 1.9 %
Eosinophils Absolute: 319 cells/uL (ref 15–500)
Eosinophils Relative: 5.4 %
HCT: 39.4 % (ref 35.0–45.0)
Hemoglobin: 13.1 g/dL (ref 11.7–15.5)
Lymphs Abs: 1794 cells/uL (ref 850–3900)
MCH: 30.3 pg (ref 27.0–33.0)
MCHC: 33.2 g/dL (ref 32.0–36.0)
MCV: 91 fL (ref 80.0–100.0)
MPV: 12 fL (ref 7.5–12.5)
Monocytes Relative: 10.9 %
Neutro Abs: 3033 cells/uL (ref 1500–7800)
Neutrophils Relative %: 51.4 %
Platelets: 210 10*3/uL (ref 140–400)
RBC: 4.33 10*6/uL (ref 3.80–5.10)
RDW: 11.9 % (ref 11.0–15.0)
Total Lymphocyte: 30.4 %
WBC: 5.9 10*3/uL (ref 3.8–10.8)

## 2020-02-05 LAB — ANTI-NUCLEAR AB-TITER (ANA TITER)
ANA TITER: 1:80 {titer} — ABNORMAL HIGH
ANA TITER: 1:80 {titer} — ABNORMAL HIGH
ANA Titer 1: 1:80 {titer} — ABNORMAL HIGH

## 2020-02-05 LAB — COMPREHENSIVE METABOLIC PANEL
AG Ratio: 1.4 (calc) (ref 1.0–2.5)
ALT: 15 U/L (ref 6–29)
AST: 22 U/L (ref 10–35)
Albumin: 4.1 g/dL (ref 3.6–5.1)
Alkaline phosphatase (APISO): 84 U/L (ref 37–153)
BUN: 19 mg/dL (ref 7–25)
CO2: 32 mmol/L (ref 20–32)
Calcium: 9 mg/dL (ref 8.6–10.4)
Chloride: 102 mmol/L (ref 98–110)
Creat: 0.84 mg/dL (ref 0.50–0.99)
Globulin: 2.9 g/dL (calc) (ref 1.9–3.7)
Glucose, Bld: 86 mg/dL (ref 65–99)
Potassium: 4.1 mmol/L (ref 3.5–5.3)
Sodium: 139 mmol/L (ref 135–146)
Total Bilirubin: 0.5 mg/dL (ref 0.2–1.2)
Total Protein: 7 g/dL (ref 6.1–8.1)

## 2020-02-05 LAB — LIPID PANEL
Cholesterol: 246 mg/dL — ABNORMAL HIGH (ref <200)
HDL: 38 mg/dL — ABNORMAL LOW (ref >=50)
LDL Cholesterol (Calc): 167 mg/dL (calc) — ABNORMAL HIGH
Non-HDL Cholesterol (Calc): 208 mg/dL (calc) — ABNORMAL HIGH (ref <130)
Total CHOL/HDL Ratio: 6.5 (calc) — ABNORMAL HIGH (ref <5.0)
Triglycerides: 253 mg/dL — ABNORMAL HIGH (ref <150)

## 2020-02-05 LAB — SEDIMENTATION RATE: Sed Rate: 14 mm/h (ref 0–30)

## 2020-02-05 LAB — TSH: TSH: 4.92 mIU/L — ABNORMAL HIGH (ref 0.40–4.50)

## 2020-02-05 LAB — VITAMIN B12: Vitamin B-12: 341 pg/mL (ref 200–1100)

## 2020-02-05 LAB — C-REACTIVE PROTEIN: CRP: 1.8 mg/L (ref <8.0)

## 2020-02-05 LAB — ANA: Anti Nuclear Antibody (ANA): POSITIVE — AB

## 2020-02-05 LAB — VITAMIN D 25 HYDROXY (VIT D DEFICIENCY, FRACTURES): Vit D, 25-Hydroxy: 81 ng/mL (ref 30–100)

## 2020-02-06 MED ORDER — SYNTHROID 112 MCG PO TABS: 112.0000 ug | ORAL_TABLET | Freq: Every day | ORAL | 0 refills | Status: DC

## 2020-02-06 NOTE — Addendum Note (Signed)
Addended by: Agnes Lawrence on: 02/06/2020 02:29 PM   Modules accepted: Orders

## 2020-03-15 ENCOUNTER — Encounter: Payer: Self-pay | Admitting: Family Medicine

## 2020-04-25 NOTE — Progress Notes (Signed)
Office Visit Note  Patient: Joanne Vaughn             Date of Birth: 15-Sep-1950           MRN: [id number]             PCP: Caren Macadam, MD Referring: Caren Macadam, MD Visit Date: 05/09/2020 Occupation: _0 @  Subjective:  Pain in multiple joints.   History of Present Illness: Joanne Vaughn is a 69 y.o. female seen in consultation per request of her PCP for evaluation of pain in multiple joints and positive ANA.  According to the patient she lived in [place] for almost 43 years.  In her 2s she started having pain in her neck, lower back and her knee joints.  At the time she was diagnosed with degenerative disc disease of her cervical spine, lumbar spine and osteoarthritis of her knee joints.  She moved to [place] eventually.  She states the symptoms gradually got worse.  In 2015 she fell and fractured her left wrist and left hip.  She required surgery on her left wrist joint and a rod placement in her left hip.  She continues to have pain and discomfort in her left hip.  She was also diagnosed with osteoarthritis in her knee joints by Dr.Alusio and underwent right partial knee replacement in 2018.  She continues to have discomfort in her right knee.  She notices some swelling in her knee joints.  She states after she drives she gets very stiff and it is difficult her to for her to move around.  She also has been experiencing nocturnal pain in her neck, lower back, left hip and her knee joint.  There is positive family history of rheumatoid arthritis in paternal grandmother.  She is gravida two, para two.  For exercise she uses elliptical, she goes for a walk and lift some weights.  She gives history of dry eyes.  There is no history of oral ulcers, nasal ulcers, dry mouth, malar rash, photosensitivity, Raynaud's phenomenon.  Activities of Daily Living:  Patient reports morning stiffness for several  hours.   Patient Reports nocturnal pain.  Difficulty  dressing/grooming: Denies Difficulty climbing stairs: Reports Difficulty getting out of chair: Reports Difficulty using hands for taps, buttons, cutlery, and/or writing: Reports  Review of Systems  Constitutional: Positive for fatigue. Negative for night sweats, weight gain and weight loss.  HENT: Negative for mouth sores, trouble swallowing, trouble swallowing, mouth dryness and nose dryness.   Eyes: Positive for redness and dryness. Negative for pain, itching and visual disturbance.  Respiratory: Negative for cough, shortness of breath and difficulty breathing.   Cardiovascular: Positive for palpitations. Negative for chest pain, hypertension, irregular heartbeat and swelling in legs/feet.       Cardio wu negative per pt.  Gastrointestinal: Negative for blood in stool, constipation and diarrhea.  Endocrine: Negative for increased urination.  Genitourinary: Negative for difficulty urinating and vaginal dryness.  Musculoskeletal: Positive for arthralgias, joint pain, joint swelling and morning stiffness. Negative for myalgias, muscle weakness, muscle tenderness and myalgias.  Skin: Negative for color change, rash, hair loss, redness, skin tightness, ulcers and sensitivity to sunlight.  Allergic/Immunologic: Negative for susceptible to infections.  Neurological: Positive for numbness, headaches and memory loss. Negative for dizziness, night sweats and weakness.  Hematological: Positive for bruising/bleeding tendency. Negative for swollen glands.  Psychiatric/Behavioral: Positive for sleep disturbance. Negative for depressed mood and confusion. The patient is nervous/anxious.     [facility]  History:  Patient Active Problem List   Diagnosis Date Noted  . Hypothyroid 04/27/2020  . Vitamin D deficiency 10/14/2019  . B12 deficiency 10/14/2019  . Osteoporosis 07/18/2019  . OA (osteoarthritis) of knee 10/22/2016  . Hip fracture ([facility]) 09/17/2013  . Patellofemoral dysfunction 10/12/2012  . Allergic  rhinitis 10/12/2012  . Primary localized osteoarthritis of right knee 10/12/2012    Past Medical History:  Diagnosis Date  . Arthritis   . Chicken pox   . Diverticulosis   . Heart murmur     Family History  Problem Relation Age of Onset  . Arthritis Other        parent/grandparent  . Heart disease Other        parent/grandparent  . Diabetes Other   . Heart disease Mother 1       MI  . Other Mother        had fall and then had brain bleed  . COPD Father 59  . Osteoarthritis Father   . Diabetes Sister   . Breast cancer Sister   . COPD Sister   . Heart disease Maternal Grandmother   . Arthritis Maternal Grandfather   . Rheum arthritis Paternal Grandmother   . Heart Problems Son    Past Surgical History:  Procedure Laterality Date  . ABDOMINAL HYSTERECTOMY     fibroid tumors; later removed ovaries because they were cystic  . BREAST BIOPSY  1973   benign  . FRACTURE SURGERY     Fracture of  L Hip and L Wrist 2015  . INTRAMEDULLARY (IM) NAIL INTERTROCHANTERIC Left 09/17/2013   Procedure: INTRAMEDULLARY (IM) NAIL INTERTROCHANTRIC;  Surgeon: [name], MD;  Location: [facility];  Service: Orthopedics;  Laterality: Left;  . ovaries removed  1994  . PARTIAL KNEE ARTHROPLASTY Right 10/22/2016   Procedure: RIGHT UNICOMPARTMENTAL KNEE;  Surgeon: Gaynelle Arabian, MD;  Location: [facility];  Service: Orthopedics;  Laterality: Right;   Social History   Social History Narrative   Work or School: homemaker, [employer] in [place]      Home Situation: lives with husband      Spiritual Beliefs: Mina Marble, Darrick Meigs      Lifestyle: elliptical 20 minutes a few times per week, very active - gardening, planting, healthy diet               Immunization History  Administered Date(s) Administered  . Influenza,inj,Quad PF,6+ Mos 08/28/2016, 04/30/2017  . Tdap 02/28/2013  . Zoster 02/28/2013     Objective: Vital Signs: BP 136/79 (BP Location: Left Arm, Patient  Position: Sitting, Cuff Size: Normal)   Pulse 79   Resp 13   Ht 5' 3" (1.6 m)   Wt 139 lb (63 kg)   BMI 24.62 kg/m    Physical Exam Vitals and nursing note reviewed.  Constitutional:      Appearance: She is well-developed.  HENT:     Head: Normocephalic and atraumatic.  Eyes:     Conjunctiva/sclera: Conjunctivae normal.  Cardiovascular:     Rate and Rhythm: Normal rate and regular rhythm.     Heart sounds: Normal heart sounds.  Pulmonary:     Effort: Pulmonary effort is normal.     Breath sounds: Normal breath sounds.  Abdominal:     General: Bowel sounds are normal.     Palpations: Abdomen is soft.  Musculoskeletal:     Cervical back: Normal range of motion.  Lymphadenopathy:     Cervical: No cervical adenopathy.  Skin:  General: Skin is warm and dry.     Capillary Refill: Capillary refill takes less than 2 seconds.  Neurological:     Mental Status: She is alert and oriented to person, place, and time.  Psychiatric:        Behavior: Behavior normal.      Musculoskeletal Exam: She has some discomfort range of motion of her cervical and lumbar spine.  She had good mobility in her cervical and lumbar spine.  Schober's was negative.  There was no SI joint tenderness.  Shoulder joints and elbow joints with good range of motion.  She had bilateral PIP and DIP thickening more prominent in her right hand.  There was some inflammatory component to the right third and fourth PIP joints.  Hip joints were in good range of motion.  She had postsurgical changes in her left hip due to rod placement.  She has right partial knee replacement with some warmth on palpation.  Left knee joint has limited extension and also varus deformity.  She has first MTP prominence and PIP and DIP thickening with no synovitis.  CDAI Exam: CDAI Score: -- Patient Global: --; Provider Global: -- Swollen: --; Tender: -- Joint Exam 05/09/2020   No joint exam has been documented for this visit   There is  currently no information documented on the homunculus. Go to the Rheumatology activity and complete the homunculus joint exam.  Investigation: No additional findings.  Imaging: No results found.  Recent Labs: Lab Results  Component Value Date   WBC 5.9 02/01/2020   HGB 13.1 02/01/2020   PLT 210 02/01/2020   NA 139 02/01/2020   K 4.1 02/01/2020   CL 102 02/01/2020   CO2 32 02/01/2020   GLUCOSE 86 02/01/2020   BUN 19 02/01/2020   CREATININE 0.84 02/01/2020   BILITOT 0.5 02/01/2020   ALKPHOS 76 01/25/2019   AST 22 02/01/2020   ALT 15 02/01/2020   PROT 7.0 02/01/2020   ALBUMIN 4.2 01/25/2019   CALCIUM 9.0 02/01/2020   GFRAA >60 10/16/2016    Speciality Comments: No specialty comments available.  Procedures:  No procedures performed Allergies: Fosamax [alendronate], Meloxicam, and Statins   Assessment / Plan:     Visit Diagnoses: Positive ANA (antinuclear antibody) - 02/01/20: ANA 1:80NS, 1:80 cytoplasmic, 1:80 NH, ESR 14, TSH 4.92, Vitamin B12 341, vitamin D 81 -she has low titer positive ANA.  There is history of sicca symptoms mostly dry eyes and dry skin.  There is no history of oral ulcers, malar rash, Raynaud's, photosensitivity.  Plan: Anti-scleroderma antibody, RNP Antibody, Anti-Smith antibody, Sjogrens syndrome-A extractable nuclear antibody, Sjogrens syndrome-B extractable nuclear antibody, Anti-DNA antibody, double-stranded, C3 and C4  Other fatigue - 02/01/20: ANA 1:80NS, 1:80 cytoplasmic, 1:80 NH, ESR 14, TSH 4.92, Vitamin B12 341, vitamin D 81  Pain in both hands -she complains of pain and discomfort in her bilateral hands.  Clinical findings are consistent with inflammatory osteoarthritis with PIP and DIP thickening.  The symptoms are more prominent in her right hand.  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  Plan: XR Hand 2 View Right, XR Hand 2 View Left, x-ray showed juxta-articular osteopenia and bilateral PIP and DIP narrowing.  Rheumatoid  factor, Cyclic citrul peptide antibody, IgG, Uric acid  Status post closed fracture of left hip - 2015.  Status post rod placement  Chronic pain of both knees - Plan: XR KNEE 3 VIEW LEFT, XR KNEE 3 VIEW RIGHT.  Right  knee joint showed prosthesis in place.  Left knee joint showed severe osteoarthritis.  Bilateral severe chondromalacia patella was noted.  Primary localized osteoarthritis of right knee - Status post partial knee replacement 2018 by DrMarland Kitchen Alusio  Neck pain -she complains of pain and stiffness in her cervical spine.  She states she gets headaches due to neck pain.  She was diagnosed with degenerative disc disease in [place] in her 93s per patient.  Plan: XR Cervical Spine 2 or 3 views.  X-rays are consistent with spondylosis and facet joint arthropathy.  Chronic midline low back pain without sciatica -she was diagnosed with degenerative disease of lumbar spine in [place] in her 15s.  She continues to have a lot of lower back pain and stiffness.  Plan: XR Lumbar Spine 2-3 Views.  Levoscoliosis, facet joint arthropathy and spondylosis was noted.  Family history of rheumatoid arthritis - Paternal grandmother  Osteoporosis without current pathological fracture, unspecified osteoporosis type - July 11, 2019 DEXA showed -the BMD measured at Femur Total is 0.653 g/cm2 with a T-score of -2.8.  She is currently on no treatment.  Vitamin D deficiency-last vitamin D level was normal.  B12 deficiency  COVID-19 virus infection - 03/2020  Educated about COVID-19 virus infection-benefits of getting COVID-19 immunization was discussed.  Information was placed in the handout.  Orders: Orders Placed This Encounter  Procedures  . XR Cervical Spine 2 or 3 views  . XR Lumbar Spine 2-3 Views  . XR KNEE 3 VIEW LEFT  . XR KNEE 3 VIEW RIGHT  . XR Hand 2 View Right  . XR Hand 2 View Left  . Rheumatoid factor  . Cyclic citrul peptide antibody, IgG  . Anti-scleroderma antibody  . RNP Antibody  .  Anti-Smith antibody  . Sjogrens syndrome-A extractable nuclear antibody  . Sjogrens syndrome-B extractable nuclear antibody  . Anti-DNA antibody, double-stranded  . C3 and C4  . Uric acid   No orders of the defined types were placed in this encounter.   Follow-Up Instructions: Return for Osteoarthritis, Osteoporosis.   Bo Merino, MD  Note - This record has been created using Editor, commissioning.  Chart creation errors have been sought, but may not always  have been located. Such creation errors do not reflect on  the standard of medical care.

## 2020-04-27 ENCOUNTER — Encounter: Payer: Self-pay | Admitting: Family Medicine

## 2020-04-27 ENCOUNTER — Ambulatory Visit (INDEPENDENT_AMBULATORY_CARE_PROVIDER_SITE_OTHER): Payer: Medicare Other | Admitting: Family Medicine

## 2020-04-27 ENCOUNTER — Other Ambulatory Visit: Payer: Self-pay

## 2020-04-27 VITALS — BP 110/60 | HR 70 | Temp 98.3°F | Ht 62.0 in | Wt 137.8 lb

## 2020-04-27 DIAGNOSIS — M810 Age-related osteoporosis without current pathological fracture: Secondary | ICD-10-CM | POA: Diagnosis not present

## 2020-04-27 DIAGNOSIS — R768 Other specified abnormal immunological findings in serum: Secondary | ICD-10-CM

## 2020-04-27 DIAGNOSIS — F419 Anxiety disorder, unspecified: Secondary | ICD-10-CM | POA: Diagnosis not present

## 2020-04-27 DIAGNOSIS — U071 COVID-19: Secondary | ICD-10-CM

## 2020-04-27 DIAGNOSIS — E039 Hypothyroidism, unspecified: Secondary | ICD-10-CM

## 2020-04-27 DIAGNOSIS — E559 Vitamin D deficiency, unspecified: Secondary | ICD-10-CM

## 2020-04-27 MED ORDER — CLONAZEPAM 0.5 MG PO TABS: 0.5000 mg | ORAL_TABLET | Freq: Every day | ORAL | 0 refills | Status: AC | PRN

## 2020-04-27 MED ORDER — SYNTHROID 112 MCG PO TABS: 112.0000 ug | ORAL_TABLET | Freq: Every day | ORAL | 1 refills | Status: DC

## 2020-04-27 NOTE — Progress Notes (Signed)
Joanne Vaughn DOB: 21-Feb-1951 Encounter date: 04/27/2020  This is a 69 y.o. female who presents with Chief Complaint  Patient presents with  . Follow-up    History of present illness: Had COVID; 9/8 and was pretty sick for a month. Took ivermectin daily which she feels helped her. Husband also had COVID and had infusion and did well.   2 weeks before that her mother passed away. Fell and had brain bleed. Rarely she will use the klonopin, but it helps her to have this. Uses for dental appointments (usually takes 1/2 for these). Still has 7 left from rx last 02/2019. Just takes portion of tablet.   Since having COVID her joints have been more painful. Has always had isses with neck and with knee; but they are worse. Hip is bothering her. Just feels like she is inflammed. Seeing rheumatology on 11/3. Hasn't been able to take meloxicam due to stomach.   Last visit was 10/2019.  At that time we discussed ongoing fatigue and difficulty with sleeping.  Additionally we discussed palpitations at that time.  Hyperlipidemia: Last blood work checked 02/01/2020.  Total cholesterol 246, HDL 38, triglycerides 253, LDL 167.  Hypothyroid: TSH checked on 02/01/2020 was 4.92. *Weakly positive ANA at 1: 80  Osteoporosis: didn't tolerate fosamax. Caused stomach upset which is common for her with medications. Is good about taking the vitamin D.   cologuard completed 03/2019 and was negative.   States that joints felt better on ivermectin; wonders about taking it regularly to help with pain.   Allergies  Allergen Reactions  . Fosamax [Alendronate]     Upset stomach  . Meloxicam     Upset stomach  . Statins Nausea Only and Other (See Comments)    Weakness and achy  intolerance   Current Meds  Medication Sig  . clonazePAM (KLONOPIN) 0.5 MG tablet Take 1 tablet (0.5 mg total) by mouth daily as needed for anxiety.  Marland Kitchen OVER THE COUNTER MEDICATION Vitamin D3 and K2  . SYNTHROID 112 MCG tablet Take 1  tablet (112 mcg total) by mouth daily before breakfast.    Review of Systems  Constitutional: Positive for fatigue. Negative for chills and fever.  Respiratory: Negative for cough, chest tightness, shortness of breath and wheezing.   Cardiovascular: Negative for chest pain, palpitations and leg swelling.  Musculoskeletal: Positive for arthralgias.    Objective:  BP 110/60 (BP Location: Left Arm, Patient Position: Sitting, Cuff Size: Normal)   Pulse 70   Temp 98.3 F (36.8 C) (Oral)   Ht 5\' 2"  (1.575 m)   Wt 137 lb 12.8 oz (62.5 kg)   BMI 25.20 kg/m   Weight: 137 lb 12.8 oz (62.5 kg)   BP Readings from Last 3 Encounters:  04/27/20 110/60  10/14/19 (!) 100/50  05/05/19 113/66   Wt Readings from Last 3 Encounters:  04/27/20 137 lb 12.8 oz (62.5 kg)  10/14/19 144 lb (65.3 kg)  05/05/19 142 lb 3.2 oz (64.5 kg)    Physical Exam Constitutional:      General: She is not in acute distress.    Appearance: She is well-developed.  Cardiovascular:     Rate and Rhythm: Normal rate and regular rhythm.     Heart sounds: Normal heart sounds. No murmur heard.  No friction rub.  Pulmonary:     Effort: Pulmonary effort is normal. No respiratory distress.     Breath sounds: Normal breath sounds. No wheezing or rales.  Musculoskeletal:  Right lower leg: No edema.     Left lower leg: No edema.  Neurological:     Mental Status: She is alert and oriented to person, place, and time.  Psychiatric:        Behavior: Behavior normal.     Assessment/Plan  1. Osteoporosis without current pathological fracture, unspecified osteoporosis type We discussed osteoporosis treatment. She had upset stomach with fosamax, which we discussed can be common side effect.  She has not been taking in adequate calcium through her diet, and since and has started taking vitamin D regularly.  She is also working on weightbearing exercise.  I discussed that frequently I will have patients who are intolerant  initially to Fosamax, but then adjust.  She is willing to try this again, but since her appointment with rheumatology is coming up so soon, we will see what evaluation they complete and she will discuss bone density treatment with them as well.  2. Anxiety He has severe anxiety and needs Klonopin.  She did recently lose her mother and is working on going through the state, selling her house, etc.  She does have a lot on her plate.  She has stopped caring for another elderly friend, which has taken quite a bit off of her plate, but she still going through a lot right now. - clonazePAM (KLONOPIN) 0.5 MG tablet; Take 1 tablet (0.5 mg total) by mouth daily as needed for anxiety.  Dispense: 20 tablet; Refill: 0  3. Vitamin D deficiency She is taking oral replacement therapy.  4. Hypothyroidism, unspecified type She has been stable on the Synthroid.  We will recheck thyroid enzyme levels today to see if we need to adjust the 112 mcg dose.  We discussed importance of not overtreating thyroid due to effects on bone density. - TSH; Future - TSH  5. Elevated antinuclear antibody (ANA) level She is having more joint pain currently, so I would repeat ANA and sed rate while she is here today.  I am curious to see with her sensation of flared joints if numbers have changed at all. - ANA; Future - Sedimentation rate; Future - Sedimentation rate - ANA  6. COVID-19 virus infection She has not had the Covid vaccine.  She has concerns about getting it.  Her family has been using ivermectin for prophylaxis.  Patient used only ivermectin for treatment when she had Covid and felt that it improved her symptoms.  She even felt that it improved her overall joint pain.  I told her I cannot recommend that she take this on a regular basis and it is interesting that she has so many medication intolerances but is able to tolerate this medication very well. We did discuss potential adverse effects of this medication when she  was in for her last visit. - SAR CoV2 Serology (COVID 19)AB(IGG)IA; Future - SAR CoV2 Serology (COVID 19)AB(IGG)IA    Return for pending bloodwork.    Micheline Rough, MD

## 2020-04-27 NOTE — Patient Instructions (Signed)
Osteoporosis  Osteoporosis is thinning and loss of density in your bones. Osteoporosis makes bones more brittle and fragile and more likely to break (fracture). Over time, osteoporosis can cause your bones to become so weak that they fracture after a minor fall. Bones in the hip, wrist, and spine are most likely to fracture due to osteoporosis. What are the causes? The exact cause of this condition is not known. What increases the risk? You may be at greater risk for osteoporosis if you:  Have a family history of the condition.  Have poor nutrition.  Use steroid medicines, such as prednisone.  Are female.  Are age 50 or older.  Smoke or have a history of smoking.  Are not physically active (are sedentary).  Are white (Caucasian) or of Asian descent.  Have a small body frame.  Take certain medicines, such as antiseizure medicines. What are the signs or symptoms? A fracture might be the first sign of osteoporosis, especially if the fracture results from a fall or injury that usually would not cause a bone to break. Other signs and symptoms include:  Pain in the neck or low back.  Stooped posture.  Loss of height. How is this diagnosed? This condition may be diagnosed based on:  Your medical history.  A physical exam.  A bone mineral density test, also called a DXA or DEXA test (dual-energy X-ray absorptiometry test). This test uses X-rays to measure the amount of minerals in your bones. How is this treated? The goal of treatment is to strengthen your bones and lower your risk for a fracture. Treatment may involve:  Making lifestyle changes, such as: ? Including foods with more calcium and vitamin D in your diet. ? Doing weight-bearing and muscle-strengthening exercises. ? Stopping tobacco use. ? Limiting alcohol intake.  Taking medicine to slow the process of bone loss or to increase bone density.  Taking daily supplements of calcium and vitamin D.  Taking  hormone replacement medicines, such as estrogen for women and testosterone for men.  Monitoring your levels of calcium and vitamin D. Follow these instructions at home:  Activity  Exercise as told by your health care provider. Ask your health care provider what exercises and activities are safe for you. You should do: ? Exercises that make you work against gravity (weight-bearing exercises), such as tai chi, yoga, or walking. ? Exercises to strengthen muscles, such as lifting weights. Lifestyle  Limit alcohol intake to no more than 1 drink a day for nonpregnant women and 2 drinks a day for men. One drink equals 12 oz of beer, 5 oz of wine, or 1 oz of hard liquor.  Do not use any products that contain nicotine or tobacco, such as cigarettes and e-cigarettes. If you need help quitting, ask your health care provider. Preventing falls  Use devices to help you move around (mobility aids) as needed, such as canes, walkers, scooters, or crutches.  Keep rooms well-lit and clutter-free.  Remove tripping hazards from walkways, including cords and throw rugs.  Install grab bars in bathrooms and safety rails on stairs.  Use rubber mats in the bathroom and other areas that are often wet or slippery.  Wear closed-toe shoes that fit well and support your feet. Wear shoes that have rubber soles or low heels.  Review your medicines with your health care provider. Some medicines can cause dizziness or changes in blood pressure, which can increase your risk of falling. General instructions  Include calcium and vitamin D in   your diet. Calcium is important for bone health, and vitamin D helps your body to absorb calcium. Good sources of calcium and vitamin D include: ? Certain fatty fish, such as salmon and tuna. ? Products that have calcium and vitamin D added to them (fortified products), such as fortified cereals. ? Egg yolks. ? Cheese. ? Liver.  Take over-the-counter and prescription medicines  only as told by your health care provider.  Keep all follow-up visits as told by your health care provider. This is important. Contact a health care provider if:  You have never been screened for osteoporosis and you are: ? A woman who is age 6 or older. ? A man who is age 65 or older. Get help right away if:  You fall or injure yourself. Summary  Osteoporosis is thinning and loss of density in your bones. This makes bones more brittle and fragile and more likely to break (fracture),even with minor falls.  The goal of treatment is to strengthen your bones and reduce your risk for a fracture.  Include calcium and vitamin D in your diet. Calcium is important for bone health, and vitamin D helps your body to absorb calcium.  Talk with your health care provider about screening for osteoporosis if you are a woman who is age 57 or older, or a man who is age 65 or older. This information is not intended to replace advice given to you by your health care provider. Make sure you discuss any questions you have with your health care provider. Document Revised: 06/05/2017 Document Reviewed: 04/17/2017 Elsevier Patient Education  [place].  Calcium Content in Foods Calcium is the most abundant mineral in your body. Most of your body's calcium supply is stored in your bones and teeth. Calcium helps many parts of the body function normally, including:  Blood and blood vessels.  Nerves.  Hormones.  Muscles.  Bones and teeth. When your calcium stores are low, you may be at risk for low bone mass, bone loss, and broken bones (fractures). When you get enough calcium, it helps to support strong bones and teeth throughout your life. Calcium is especially important for:  Children during growth spurts.  Girls during adolescence.  Women who are pregnant or breastfeeding.  Women after their menstrual cycle stops (postmenopause).  Women whose menstrual cycle has stopped due to  anorexia nervosa or regular intense exercise.  People who cannot eat or digest dairy products.  Vegans. What are tips for getting more calcium? General information  Try to get most of your calcium from food. Eat foods that are high in calcium.  Some people may benefit from taking calcium supplements. Check with your health care provider or diet and nutrition specialist (dietitian) before starting any calcium supplements. Calcium supplements may interact with certain medicines. Too much calcium may cause other health problems, like constipation and kidney stones.  For the body to absorb calcium, it needs vitamin D. Sources of vitamin D include: ? Skin exposure to direct sunlight. ? Foods, such as egg yolks, liver, saltwater fish, and fortified milk. ? Vitamin D supplements. Check with your health care provider or dietitian before starting any vitamin D supplements. What foods are high in calcium?  High-calcium foods are those that contain more than 100 milligrams (mg) of calcium per serving. Fruits  Fortified orange or other fruit juice, 300 mg per 8 oz serving. Vegetables  Collard greens, 360 mg per 8 oz serving.  Kale, 180 mg per 8 oz serving.  Bok choy, 160 mg per 8 oz serving. Grains  Fortified ready-to-eat cereals, 100-1,000 mg per 8 oz serving.  Fortified frozen waffles, 200 mg in two waffles. Meats and other proteins  Sardines, canned with bones, 325 mg per 3 oz serving.  Salmon, canned with bones, 180 mg per 3 oz serving.  Canned shrimp, 125 mg per 3 oz serving.  Baked beans, 160 mg per 4 oz serving. Dairy  Yogurt, plain, low-fat, 310 mg per 6 oz serving.  Milk, 300 mg per 8 oz serving.  American cheese, 195 mg per 1 oz serving.  Cheddar cheese, 205 mg per 1 oz serving.  Cottage cheese 2%, 105 mg per 4 oz serving.  Fortified soy, rice, or almond milk, 300 mg per 8 oz serving. The items listed above may not be a complete list of foods high in calcium.  Actual amounts of calcium may be different depending on processing. Contact a dietitian for more information. What foods are lower in calcium? Foods lower in calcium are those that contain 50 mg of calcium or less per serving. Fruits  Apple, about 6 mg in one apple.  Banana, about 12 mg in one banana. Vegetables  Lettuce, 19 mg per 2 oz serving.  Tomato, about 11 mg in one tomato. Grains  Rice, 4 mg per 6 oz serving.  Boiled potatoes, 14 mg per 8 oz serving.  White bread, 6 mg in one slice. Meats and other proteins  Egg, 27 mg per 2 oz serving.  Red meat, 7 mg per 4 oz serving.  Chicken, 17 mg per 4 oz serving.  Fish, cod or trout, 20 mg per 4 oz serving. The items listed above may not be a complete list of foods lower in calcium. Actual amounts of calcium may be different depending on processing. Contact a dietitian for more information. Summary  Calcium is an important mineral in the body because it affects many functions. Getting enough calcium helps support strong bones and teeth throughout your life.  Try to get most of your calcium from food.  Calcium supplements may interact with certain medicines. Check with your health care provider before starting any calcium supplements. This information is not intended to replace advice given to you by your health care provider. Make sure you discuss any questions you have with your health care provider. Document Revised: 06/16/2017 Document Reviewed: 06/16/2017 Elsevier Patient Education  2020 [employer].

## 2020-04-30 LAB — ANA: Anti Nuclear Antibody (ANA): POSITIVE — AB

## 2020-04-30 LAB — SARS-COV-2 ANTIBODY(IGG)SPIKE,SEMI-QUANTITATIVE: SARS COV1 AB(IGG)SPIKE,SEMI QN: >20 index — ABNORMAL HIGH (ref <1.00)

## 2020-04-30 LAB — SEDIMENTATION RATE: Sed Rate: 19 mm/h (ref 0–30)

## 2020-04-30 LAB — ANTI-NUCLEAR AB-TITER (ANA TITER)
ANA TITER: 1:80 {titer} — ABNORMAL HIGH
ANA Titer 1: 1:80 {titer} — ABNORMAL HIGH

## 2020-04-30 LAB — TSH: TSH: 0.15 mIU/L — ABNORMAL LOW (ref 0.40–4.50)

## 2020-05-01 MED ORDER — LEVOTHYROXINE SODIUM 100 MCG PO TABS: 100.0000 ug | ORAL_TABLET | Freq: Every day | ORAL | 1 refills | Status: AC

## 2020-05-01 NOTE — Addendum Note (Signed)
Addended by: Agnes Lawrence on: 05/01/2020 09:39 AM   Modules accepted: Orders

## 2020-05-09 ENCOUNTER — Encounter: Payer: Self-pay | Admitting: Rheumatology

## 2020-05-09 ENCOUNTER — Ambulatory Visit: Payer: Self-pay

## 2020-05-09 ENCOUNTER — Other Ambulatory Visit: Payer: Self-pay

## 2020-05-09 ENCOUNTER — Ambulatory Visit (INDEPENDENT_AMBULATORY_CARE_PROVIDER_SITE_OTHER): Payer: Medicare Other | Admitting: Rheumatology

## 2020-05-09 VITALS — BP 136/79 | HR 79 | Resp 13 | Ht 63.0 in | Wt 139.0 lb

## 2020-05-09 DIAGNOSIS — Z8261 Family history of arthritis: Secondary | ICD-10-CM

## 2020-05-09 DIAGNOSIS — Z7189 Other specified counseling: Secondary | ICD-10-CM

## 2020-05-09 DIAGNOSIS — M79642 Pain in left hand: Secondary | ICD-10-CM

## 2020-05-09 DIAGNOSIS — M25562 Pain in left knee: Secondary | ICD-10-CM

## 2020-05-09 DIAGNOSIS — R5383 Other fatigue: Secondary | ICD-10-CM | POA: Diagnosis not present

## 2020-05-09 DIAGNOSIS — Z8781 Personal history of (healed) traumatic fracture: Secondary | ICD-10-CM

## 2020-05-09 DIAGNOSIS — M79641 Pain in right hand: Secondary | ICD-10-CM

## 2020-05-09 DIAGNOSIS — M25561 Pain in right knee: Secondary | ICD-10-CM

## 2020-05-09 DIAGNOSIS — E559 Vitamin D deficiency, unspecified: Secondary | ICD-10-CM

## 2020-05-09 DIAGNOSIS — U071 COVID-19: Secondary | ICD-10-CM

## 2020-05-09 DIAGNOSIS — M545 Low back pain, unspecified: Secondary | ICD-10-CM

## 2020-05-09 DIAGNOSIS — M542 Cervicalgia: Secondary | ICD-10-CM

## 2020-05-09 DIAGNOSIS — E538 Deficiency of other specified B group vitamins: Secondary | ICD-10-CM

## 2020-05-09 DIAGNOSIS — R768 Other specified abnormal immunological findings in serum: Secondary | ICD-10-CM

## 2020-05-09 DIAGNOSIS — M1711 Unilateral primary osteoarthritis, right knee: Secondary | ICD-10-CM

## 2020-05-09 DIAGNOSIS — G8929 Other chronic pain: Secondary | ICD-10-CM | POA: Diagnosis not present

## 2020-05-09 DIAGNOSIS — M810 Age-related osteoporosis without current pathological fracture: Secondary | ICD-10-CM

## 2020-05-09 NOTE — Patient Instructions (Signed)

## 2020-05-10 LAB — ANTI-DNA ANTIBODY, DOUBLE-STRANDED: ds DNA Ab: 1 IU/mL

## 2020-05-10 LAB — CYCLIC CITRUL PEPTIDE ANTIBODY, IGG: Cyclic Citrullin Peptide Ab: <16 UNITS

## 2020-05-10 LAB — SJOGRENS SYNDROME-A EXTRACTABLE NUCLEAR ANTIBODY: SSA (Ro) (ENA) Antibody, IgG: <1 AI

## 2020-05-10 LAB — C3 AND C4
C3 Complement: 118 mg/dL (ref 83–193)
C4 Complement: 29 mg/dL (ref 15–57)

## 2020-05-10 LAB — ANTI-SCLERODERMA ANTIBODY: Scleroderma (Scl-70) (ENA) Antibody, IgG: <1 AI

## 2020-05-10 LAB — ANTI-SMITH ANTIBODY: ENA SM Ab Ser-aCnc: <1 AI

## 2020-05-10 LAB — RHEUMATOID FACTOR: Rheumatoid fact SerPl-aCnc: <14 IU/mL (ref <14)

## 2020-05-10 LAB — RNP ANTIBODY: Ribonucleic Protein(ENA) Antibody, IgG: <1 AI

## 2020-05-10 LAB — URIC ACID: Uric Acid, Serum: 4.7 mg/dL (ref 2.5–7.0)

## 2020-05-10 LAB — SJOGRENS SYNDROME-B EXTRACTABLE NUCLEAR ANTIBODY: SSB (La) (ENA) Antibody, IgG: <1 AI

## 2020-05-10 NOTE — Progress Notes (Signed)
I will discuss results at the follow-up visit.

## 2020-05-30 NOTE — Progress Notes (Signed)
Office Visit Note  Patient: Joanne Vaughn             Date of Birth: [birth year]           MRN: [id number]             PCP: Caren Macadam, MD Referring: Caren Macadam, MD Visit Date: 06/06/2020 Occupation: @GUAROCC @  Subjective:  Dry mouth, dry eyes and joint pain.   History of Present Illness: Lanee Chain is a 69 y.o. female with history of sicca symptoms, osteoarthritis, degenerative disc disease and osteoporosis.  She states she continues to have dry eyes.  Dry mouth is tolerable.  She has tried different eyedrops in the past without good results.  She continues to have some stiffness in her hands, knee joints and her lower back.  Her right knee joint is replaced.  Activities of Daily Living:  Patient reports morning stiffness for all day. Patient Reports nocturnal pain.  Difficulty dressing/grooming: Denies Difficulty climbing stairs: Denies Difficulty getting out of chair: Denies Difficulty using hands for taps, buttons, cutlery, and/or writing: Reports  Review of Systems  Constitutional: Positive for fatigue.  HENT: Negative for mouth sores, mouth dryness and nose dryness.   Eyes: Positive for dryness. Negative for pain and itching.  Respiratory: Negative for shortness of breath and difficulty breathing.   Cardiovascular: Negative for chest pain and palpitations.  Gastrointestinal: Positive for heartburn. Negative for blood in stool, constipation and diarrhea.  Endocrine: Negative for increased urination.  Genitourinary: Negative for difficulty urinating.  Musculoskeletal: Positive for arthralgias, joint pain, myalgias, morning stiffness, muscle tenderness and myalgias. Negative for joint swelling.  Skin: Negative for color change, rash and redness.  Allergic/Immunologic: Negative for susceptible to infections.  Neurological: Positive for numbness and headaches. Negative for dizziness, memory loss and weakness.  Hematological: Negative for  bruising/bleeding tendency.  Psychiatric/Behavioral: Negative for confusion.    PMFS History:  Patient Active Problem List   Diagnosis Date Noted  . Hypothyroid 04/27/2020  . Vitamin D deficiency 10/14/2019  . B12 deficiency 10/14/2019  . Osteoporosis 07/18/2019  . OA (osteoarthritis) of knee 10/22/2016  . Hip fracture ([place]) 09/17/2013  . Patellofemoral dysfunction 10/12/2012  . Allergic rhinitis 10/12/2012  . Primary localized osteoarthritis of right knee 10/12/2012    Past Medical History:  Diagnosis Date  . Arthritis   . Chicken pox   . Diverticulosis   . Heart murmur     Family History  Problem Relation Age of Onset  . Arthritis Other        parent/grandparent  . Heart disease Other        parent/grandparent  . Diabetes Other   . Heart disease Mother 50       MI  . Other Mother        had fall and then had brain bleed  . COPD Father 56  . Osteoarthritis Father   . Diabetes Sister   . Breast cancer Sister   . COPD Sister   . Heart disease Maternal Grandmother   . Arthritis Maternal Grandfather   . Rheum arthritis Paternal Grandmother   . Heart Problems Son    Past Surgical History:  Procedure Laterality Date  . ABDOMINAL HYSTERECTOMY     fibroid tumors; later removed ovaries because they were cystic  . BREAST BIOPSY  1973   benign  . FRACTURE SURGERY     Fracture of  L Hip and L Wrist 2015  . INTRAMEDULLARY (IM) NAIL INTERTROCHANTERIC Left  09/17/2013   Procedure: INTRAMEDULLARY (IM) NAIL INTERTROCHANTRIC;  Surgeon: [name], MD;  Location: [facility];  Service: Orthopedics;  Laterality: Left;  . ovaries removed  1994  . PARTIAL KNEE ARTHROPLASTY Right 10/22/2016   Procedure: RIGHT UNICOMPARTMENTAL KNEE;  Surgeon: Gaynelle Arabian, MD;  Location: [facility];  Service: Orthopedics;  Laterality: Right;   Social History   Social History Narrative   Work or School: homemaker, [employer] in [place]      Home Situation: lives with husband       Spiritual Beliefs: Mina Marble, Darrick Meigs      Lifestyle: elliptical 20 minutes a few times per week, very active - gardening, planting, healthy diet               Immunization History  Administered Date(s) Administered  . Influenza,inj,Quad PF,6+ Mos 08/28/2016, 04/30/2017  . Tdap 02/28/2013  . Zoster 02/28/2013     Objective: Vital Signs: BP 123/69 (BP Location: Left Arm, Patient Position: Sitting, Cuff Size: Normal)   Pulse 68   Resp 14   Ht 5' 3"  (1.6 m)   Wt 141 lb 9.6 oz (64.2 kg)   BMI 25.08 kg/m    Physical Exam Vitals and nursing note reviewed.  Constitutional:      Appearance: She is well-developed.  HENT:     Head: Normocephalic and atraumatic.  Eyes:     Conjunctiva/sclera: Conjunctivae normal.  Cardiovascular:     Rate and Rhythm: Normal rate and regular rhythm.     Heart sounds: Normal heart sounds.  Pulmonary:     Effort: Pulmonary effort is normal.     Breath sounds: Normal breath sounds.  Abdominal:     General: Bowel sounds are normal.     Palpations: Abdomen is soft.  Musculoskeletal:     Cervical back: Normal range of motion.  Lymphadenopathy:     Cervical: No cervical adenopathy.  Skin:    General: Skin is warm and dry.     Capillary Refill: Capillary refill takes less than 2 seconds.     Comments: She has dry skin.  No nailbed capillary changes were noted.  No telangiectasias were noted.  Neurological:     Mental Status: She is alert and oriented to person, place, and time.  Psychiatric:        Behavior: Behavior normal.      Musculoskeletal Exam: C-spine was in good range of motion.  She is some discomfort range of motion of her lumbar spine.  Shoulder joints, elbow joints, wrist joints with good range of motion.  She has bilateral PIP and DIP thickening with no synovitis.  Hip joints with good range of motion.  Right knee joint is replaced with incomplete extension.  Left knee joint was in good range of motion without warmth swelling or  effusion.  She had no tenderness over ankles or MTPs. CDAI Exam: CDAI Score: -- Patient Global: --; Provider Global: -- Swollen: --; Tender: -- Joint Exam 06/06/2020   No joint exam has been documented for this visit   There is currently no information documented on the homunculus. Go to the Rheumatology activity and complete the homunculus joint exam.  Investigation: No additional findings.  Imaging: XR Cervical Spine 2 or 3 views  Result Date: 05/09/2020 Multilevel spondylosis was noted.  Narrowing between C5-C6 was noted.  Mild facet joint arthropathy was noted. Impression: These findings are consistent with spondylosis and facet joint arthropathy of the cervical spine.  XR Hand 2 View Left  Result Date: 05/09/2020 Juxta-articular osteopenia was noted.  CMC, PIP and DIP narrowing was noted.  No MCP, intercarpal radiocarpal joint space narrowing was noted. Impression: These findings are consistent with inflammatory arthritis and osteoarthritis overlap.  XR Hand 2 View Right  Result Date: 05/09/2020 Narrowing of first and second MCPs was noted.  Juxta-articular osteopenia was noted.  Narrowing of PIP and DIP joints was noted.  No intercarpal radiocarpal joint space narrowing was noted.  No erosive changes were noted. Impression: These findings could be consistent with inflammatory arthritis and osteoarthritis overlap.  XR KNEE 3 VIEW LEFT  Result Date: 05/09/2020 Severe medial compartment narrowing was noted.  Severe patellofemoral narrowing was noted.  No chondrocalcinosis was noted. Impression: These findings are consistent with severe osteoarthritis and severe chondromalacia patella.  XR KNEE 3 VIEW RIGHT  Result Date: 05/09/2020 Severe patellofemoral narrowing was noted.  Prosthesis appears in place in the medial compartment.  No chondrocalcinosis was noted. Impression: These findings are consistent with partial knee replacement and severe chondromalacia patella.  XR Lumbar  Spine 2-3 Views  Result Date: 05/09/2020 Severe levoscoliosis was noted.  Multilevel spondylosis was noted.  Significant narrowing was noted between L3-L4, L4-L5.  Facet joint arthropathy was noted.  Hardware was noted in the left femoral head. Impression: These findings are consistent with levoscoliosis, multilevel spondylosis and facet joint arthropathy.     Recent Labs: Lab Results  Component Value Date   WBC 5.9 02/01/2020   HGB 13.1 02/01/2020   PLT 210 02/01/2020   NA 139 02/01/2020   K 4.1 02/01/2020   CL 102 02/01/2020   CO2 32 02/01/2020   GLUCOSE 86 02/01/2020   BUN 19 02/01/2020   CREATININE 0.84 02/01/2020   BILITOT 0.5 02/01/2020   ALKPHOS 76 01/25/2019   AST 22 02/01/2020   ALT 15 02/01/2020   PROT 7.0 02/01/2020   ALBUMIN 4.2 01/25/2019   CALCIUM 9.0 02/01/2020   GFRAA >60 10/16/2016   May 09, 2020 ENA negative, C3-C4 normal, RF negative, anti-CCP negative, uric acid 4.7  02/01/20: ANA 1:80NS, 1:80 cytoplasmic, 1:80 NH, ESR 14, TSH 4.92, Vitamin B12 341, vitamin D 81   Speciality Comments: No specialty comments available.  Procedures:  No procedures performed Allergies: Fosamax [alendronate], Meloxicam, and Statins   Assessment / Plan:     Visit Diagnoses: Sicca complex ([place]) - History of dry mouth and dry eyes, ANA 1: 80 speckled, Ro Ab negative, La Ab negative, RF-.  Her most prominent symptom is dry eyes.  She has tried different over-the-counter eyedrops in the past.  We had detailed discussion regarding over-the-counter products.  I also discussed possible option of adding pilocarpine.  Indications side effects contraindications were discussed at length.  She wants to proceed with pilocarpine.  She was given a prescription for pilocarpine 5 mg p.o. 3 times daily as needed total 90 tablets with 2 refills were given.  Use of humidifier and topical therapies for the skin were also discussed.  Primary osteoarthritis of both hands - Clinical and radiographic  findings are consistent with inflammatory osteoarthritis.  She has severe osteoarthritis.  Joint protection muscle strengthening was discussed.  A handout on hand exercises was given.  Status post closed fracture of left hip - Status post rod placement 2015.  Primary osteoarthritis of left knee - Severe osteoarthritis and severe chondromalacia patella.  She has chronic pain.  Status post right partial knee replacement - 2018 by Dr. Maureen Ralphs.  She has limited extension.  DDD (degenerative disc disease),  cervical-she had good range of motion of her cervical spine without discomfort.  DDD (degenerative disc disease), lumbar - Levoscoliosis, multilevel spondylosis and facet joint arthropathy.  She suffers from chronic pain.  Family history of rheumatoid arthritis - Paternal grandmother.  Osteoporosis without current pathological fracture, unspecified osteoporosis type - July 11, 2019 DEXA showed -the BMD measured at [facility] Total is 0.653 g/cm2 with a T-score of -2.8.  She is currently on no treatment.  He had detailed discussion regarding different treatment options.  She states she tried Fosamax once in April but she does not recall what symptoms she had.  In the chart GI side effects were mentioned.  She wants to try Fosamax again.  Indications side effects contraindications were discussed at length.  Use of calcium, vitamin D and resistive exercises were discussed.  Have given her prescription for Fosamax 70 mg p.o. weekly.  If she has intolerance then we may consider other treatment options including Prolia.  Vitamin D deficiency-her vitamin D level was normal this year.  B12 deficiency  COVID-19 virus infection - September 2021  Orders: No orders of the defined types were placed in this encounter.  Meds ordered this encounter  Medications  . pilocarpine (SALAGEN) 5 MG tablet    Sig: Take 1 tablet (5 mg total) by mouth 3 (three) times daily as needed.    Dispense:  90 tablet    Refill:   2  . alendronate (FOSAMAX) 70 MG tablet    Sig: Take 1 tablet (70 mg total) by mouth once a week. Take with a full glass of water on an empty stomach.    Dispense:  4 tablet    Refill:  2     Follow-Up Instructions: Return in about 3 months (around 09/04/2020) for Osteoarthritis, Osteoporosis, sicca.   Bo Merino, MD  Note - This record has been created using Editor, commissioning.  Chart creation errors have been sought, but may not always  have been located. Such creation errors do not reflect on  the standard of medical care.

## 2020-06-06 ENCOUNTER — Other Ambulatory Visit: Payer: Self-pay

## 2020-06-06 ENCOUNTER — Encounter: Payer: Self-pay | Admitting: Rheumatology

## 2020-06-06 ENCOUNTER — Ambulatory Visit (INDEPENDENT_AMBULATORY_CARE_PROVIDER_SITE_OTHER): Payer: Medicare Other | Admitting: Rheumatology

## 2020-06-06 VITALS — BP 123/69 | HR 68 | Resp 14 | Ht 63.0 in | Wt 141.6 lb

## 2020-06-06 DIAGNOSIS — M19041 Primary osteoarthritis, right hand: Secondary | ICD-10-CM

## 2020-06-06 DIAGNOSIS — Z96651 Presence of right artificial knee joint: Secondary | ICD-10-CM

## 2020-06-06 DIAGNOSIS — Z8261 Family history of arthritis: Secondary | ICD-10-CM

## 2020-06-06 DIAGNOSIS — M19042 Primary osteoarthritis, left hand: Secondary | ICD-10-CM

## 2020-06-06 DIAGNOSIS — M3500 Sicca syndrome, unspecified: Secondary | ICD-10-CM | POA: Diagnosis not present

## 2020-06-06 DIAGNOSIS — E559 Vitamin D deficiency, unspecified: Secondary | ICD-10-CM

## 2020-06-06 DIAGNOSIS — M5030 Other cervical disc degeneration, unspecified cervical region: Secondary | ICD-10-CM

## 2020-06-06 DIAGNOSIS — Z8781 Personal history of (healed) traumatic fracture: Secondary | ICD-10-CM | POA: Diagnosis not present

## 2020-06-06 DIAGNOSIS — M1712 Unilateral primary osteoarthritis, left knee: Secondary | ICD-10-CM | POA: Diagnosis not present

## 2020-06-06 DIAGNOSIS — M810 Age-related osteoporosis without current pathological fracture: Secondary | ICD-10-CM

## 2020-06-06 DIAGNOSIS — M51369 Other intervertebral disc degeneration, lumbar region without mention of lumbar back pain or lower extremity pain: Secondary | ICD-10-CM

## 2020-06-06 DIAGNOSIS — U071 COVID-19: Secondary | ICD-10-CM

## 2020-06-06 DIAGNOSIS — E538 Deficiency of other specified B group vitamins: Secondary | ICD-10-CM

## 2020-06-06 DIAGNOSIS — M5136 Other intervertebral disc degeneration, lumbar region: Secondary | ICD-10-CM

## 2020-06-06 MED ORDER — PILOCARPINE HCL 5 MG PO TABS: 5.0000 mg | ORAL_TABLET | Freq: Three times a day (TID) | ORAL | 2 refills | Status: AC | PRN

## 2020-06-06 MED ORDER — ALENDRONATE SODIUM 70 MG PO TABS: 70.0000 mg | ORAL_TABLET | ORAL | 2 refills | Status: DC

## 2020-06-06 NOTE — Patient Instructions (Signed)
Back Exercises The following exercises strengthen the muscles that help to support the trunk and back. They also help to keep the lower back flexible. Doing these exercises can help to prevent back pain or lessen existing pain.  If you have back pain or discomfort, try doing these exercises 2-3 times each day or as told by your health care provider.  As your pain improves, do them once each day, but increase the number of times that you repeat the steps for each exercise (do more repetitions).  To prevent the recurrence of back pain, continue to do these exercises once each day or as told by your health care provider. Do exercises exactly as told by your health care provider and adjust them as directed. It is normal to feel mild stretching, pulling, tightness, or discomfort as you do these exercises, but you should stop right away if you feel sudden pain or your pain gets worse. Exercises Single knee to chest Repeat these steps 3-5 times for each leg: 1. Lie on your back on a firm bed or the floor with your legs extended. 2. Bring one knee to your chest. Your other leg should stay extended and in contact with the floor. 3. Hold your knee in place by grabbing your knee or thigh with both hands and hold. 4. Pull on your knee until you feel a gentle stretch in your lower back or buttocks. 5. Hold the stretch for 10-30 seconds. 6. Slowly release and straighten your leg. Pelvic tilt Repeat these steps 5-10 times: 1. Lie on your back on a firm bed or the floor with your legs extended. 2. Bend your knees so they are pointing toward the ceiling and your feet are flat on the floor. 3. Tighten your lower abdominal muscles to press your lower back against the floor. This motion will tilt your pelvis so your tailbone points up toward the ceiling instead of pointing to your feet or the floor. 4. With gentle tension and even breathing, hold this position for 5-10 seconds. Cat-cow Repeat these steps until  your lower back becomes more flexible: 1. Get into a hands-and-knees position on a firm surface. Keep your hands under your shoulders, and keep your knees under your hips. You may place padding under your knees for comfort. 2. Let your head hang down toward your chest. Contract your abdominal muscles and point your tailbone toward the floor so your lower back becomes rounded like the back of a cat. 3. Hold this position for 5 seconds. 4. Slowly lift your head, let your abdominal muscles relax and point your tailbone up toward the ceiling so your back forms a sagging arch like the back of a cow. 5. Hold this position for 5 seconds.  Press-ups Repeat these steps 5-10 times: 1. Lie on your abdomen (face-down) on the floor. 2. Place your palms near your head, about shoulder-width apart. 3. Keeping your back as relaxed as possible and keeping your hips on the floor, slowly straighten your arms to raise the top half of your body and lift your shoulders. Do not use your back muscles to raise your upper torso. You may adjust the placement of your hands to make yourself more comfortable. 4. Hold this position for 5 seconds while you keep your back relaxed. 5. Slowly return to lying flat on the floor.  Bridges Repeat these steps 10 times: 1. Lie on your back on a firm surface. 2. Bend your knees so they are pointing toward the ceiling and   your feet are flat on the floor. Your arms should be flat at your sides, next to your body. 3. Tighten your buttocks muscles and lift your buttocks off the floor until your waist is at almost the same height as your knees. You should feel the muscles working in your buttocks and the back of your thighs. If you do not feel these muscles, slide your feet 1-2 inches farther away from your buttocks. 4. Hold this position for 3-5 seconds. 5. Slowly lower your hips to the starting position, and allow your buttocks muscles to relax completely. If this exercise is too easy, try  doing it with your arms crossed over your chest. Abdominal crunches Repeat these steps 5-10 times: 1. Lie on your back on a firm bed or the floor with your legs extended. 2. Bend your knees so they are pointing toward the ceiling and your feet are flat on the floor. 3. Cross your arms over your chest. 4. Tip your chin slightly toward your chest without bending your neck. 5. Tighten your abdominal muscles and slowly raise your trunk (torso) high enough to lift your shoulder blades a tiny bit off the floor. Avoid raising your torso higher than that because it can put too much stress on your low back and does not help to strengthen your abdominal muscles. 6. Slowly return to your starting position. Back lifts Repeat these steps 5-10 times: 1. Lie on your abdomen (face-down) with your arms at your sides, and rest your forehead on the floor. 2. Tighten the muscles in your legs and your buttocks. 3. Slowly lift your chest off the floor while you keep your hips pressed to the floor. Keep the back of your head in line with the curve in your back. Your eyes should be looking at the floor. 4. Hold this position for 3-5 seconds. 5. Slowly return to your starting position. Contact a health care provider if:  Your back pain or discomfort gets much worse when you do an exercise.  Your worsening back pain or discomfort does not lessen within 2 hours after you exercise. If you have any of these problems, stop doing these exercises right away. Do not do them again unless your health care provider says that you can. Get help right away if:  You develop sudden, severe back pain. If this happens, stop doing the exercises right away. Do not do them again unless your health care provider says that you can. This information is not intended to replace advice given to you by your health care provider. Make sure you discuss any questions you have with your health care provider. Document Revised: 10/28/2018 Document  Reviewed: 03/25/2018 [employer] Patient Education  2020 [employer] Journal for Nurse Practitioners, 15(4), 263-267. Retrieved April 12, 2018 from http://clinicalkey.com/nursing">  Knee Exercises Ask your health care provider which exercises are safe for you. Do exercises exactly as told by your health care provider and adjust them as directed. It is normal to feel mild stretching, pulling, tightness, or discomfort as you do these exercises. Stop right away if you feel sudden pain or your pain gets worse. Do not begin these exercises until told by your health care provider. Stretching and range-of-motion exercises These exercises warm up your muscles and joints and improve the movement and flexibility of your knee. These exercises also help to relieve pain and swelling. Knee extension, prone 1. Lie on your abdomen (prone position) on a bed. 2. Place your left / right knee just beyond the   edge of the surface so your knee is not on the bed. You can put a towel under your left / right thigh just above your kneecap for comfort. 3. Relax your leg muscles and allow gravity to straighten your knee (extension). You should feel a stretch behind your left / right knee. 4. Hold this position for __________ seconds. 5. Scoot up so your knee is supported between repetitions. Repeat __________ times. Complete this exercise __________ times a day. Knee flexion, active  1. Lie on your back with both legs straight. If this causes back discomfort, bend your left / right knee so your foot is flat on the floor. 2. Slowly slide your left / right heel back toward your buttocks. Stop when you feel a gentle stretch in the front of your knee or thigh (flexion). 3. Hold this position for __________ seconds. 4. Slowly slide your left / right heel back to the starting position. Repeat __________ times. Complete this exercise __________ times a day. Quadriceps stretch, prone  1. Lie on your abdomen on a firm surface,  such as a bed or padded floor. 2. Bend your left / right knee and hold your ankle. If you cannot reach your ankle or pant leg, loop a belt around your foot and grab the belt instead. 3. Gently pull your heel toward your buttocks. Your knee should not slide out to the side. You should feel a stretch in the front of your thigh and knee (quadriceps). 4. Hold this position for __________ seconds. Repeat __________ times. Complete this exercise __________ times a day. Hamstring, supine 1. Lie on your back (supine position). 2. Loop a belt or towel over the ball of your left / right foot. The ball of your foot is on the walking surface, right under your toes. 3. Straighten your left / right knee and slowly pull on the belt to raise your leg until you feel a gentle stretch behind your knee (hamstring). ? Do not let your knee bend while you do this. ? Keep your other leg flat on the floor. 4. Hold this position for __________ seconds. Repeat __________ times. Complete this exercise __________ times a day. Strengthening exercises These exercises build strength and endurance in your knee. Endurance is the ability to use your muscles for a long time, even after they get tired. Quadriceps, isometric This exercise stretches the muscles in front of your thigh (quadriceps) without moving your knee joint (isometric). 1. Lie on your back with your left / right leg extended and your other knee bent. Put a rolled towel or small pillow under your knee if told by your health care provider. 2. Slowly tense the muscles in the front of your left / right thigh. You should see your kneecap slide up toward your hip or see increased dimpling just above the knee. This motion will push the back of the knee toward the floor. 3. For __________ seconds, hold the muscle as tight as you can without increasing your pain. 4. Relax the muscles slowly and completely. Repeat __________ times. Complete this exercise __________ times a  day. Straight leg raises This exercise stretches the muscles in front of your thigh (quadriceps) and the muscles that move your hips (hip flexors). 1. Lie on your back with your left / right leg extended and your other knee bent. 2. Tense the muscles in the front of your left / right thigh. You should see your kneecap slide up or see increased dimpling just above the knee. Your   thigh may even shake a bit. 3. Keep these muscles tight as you raise your leg 4-6 inches (10-15 cm) off the floor. Do not let your knee bend. 4. Hold this position for __________ seconds. 5. Keep these muscles tense as you lower your leg. 6. Relax your muscles slowly and completely after each repetition. Repeat __________ times. Complete this exercise __________ times a day. Hamstring, isometric 1. Lie on your back on a firm surface. 2. Bend your left / right knee about __________ degrees. 3. Dig your left / right heel into the surface as if you are trying to pull it toward your buttocks. Tighten the muscles in the back of your thighs (hamstring) to "dig" as hard as you can without increasing any pain. 4. Hold this position for __________ seconds. 5. Release the tension gradually and allow your muscles to relax completely for __________ seconds after each repetition. Repeat __________ times. Complete this exercise __________ times a day. Hamstring curls If told by your health care provider, do this exercise while wearing ankle weights. Begin with __________ lb weights. Then increase the weight by 1 lb (0.5 kg) increments. Do not wear ankle weights that are more than __________ lb. 1. Lie on your abdomen with your legs straight. 2. Bend your left / right knee as far as you can without feeling pain. Keep your hips flat against the floor. 3. Hold this position for __________ seconds. 4. Slowly lower your leg to the starting position. Repeat __________ times. Complete this exercise __________ times a day. Squats This  exercise strengthens the muscles in front of your thigh and knee (quadriceps). 1. Stand in front of a table, with your feet and knees pointing straight ahead. You may rest your hands on the table for balance but not for support. 2. Slowly bend your knees and lower your hips like you are going to sit in a chair. ? Keep your weight over your heels, not over your toes. ? Keep your lower legs upright so they are parallel with the table legs. ? Do not let your hips go lower than your knees. ? Do not bend lower than told by your health care provider. ? If your knee pain increases, do not bend as low. 3. Hold the squat position for __________ seconds. 4. Slowly push with your legs to return to standing. Do not use your hands to pull yourself to standing. Repeat __________ times. Complete this exercise __________ times a day. Wall slides This exercise strengthens the muscles in front of your thigh and knee (quadriceps). 1. Lean your back against a smooth wall or door, and walk your feet out 18-24 inches (46-61 cm) from it. 2. Place your feet hip-width apart. 3. Slowly slide down the wall or door until your knees bend __________ degrees. Keep your knees over your heels, not over your toes. Keep your knees in line with your hips. 4. Hold this position for __________ seconds. Repeat __________ times. Complete this exercise __________ times a day. Straight leg raises This exercise strengthens the muscles that rotate the leg at the hip and move it away from your body (hip abductors). 1. Lie on your side with your left / right leg in the top position. Lie so your head, shoulder, knee, and hip line up. You may bend your bottom knee to help you keep your balance. 2. Roll your hips slightly forward so your hips are stacked directly over each other and your left / right knee is facing forward. 3. Leading   with your heel, lift your top leg 4-6 inches (10-15 cm). You should feel the muscles in your outer hip  lifting. ? Do not let your foot drift forward. ? Do not let your knee roll toward the ceiling. 4. Hold this position for __________ seconds. 5. Slowly return your leg to the starting position. 6. Let your muscles relax completely after each repetition. Repeat __________ times. Complete this exercise __________ times a day. Straight leg raises This exercise stretches the muscles that move your hips away from the front of the pelvis (hip extensors). 1. Lie on your abdomen on a firm surface. You can put a pillow under your hips if that is more comfortable. 2. Tense the muscles in your buttocks and lift your left / right leg about 4-6 inches (10-15 cm). Keep your knee straight as you lift your leg. 3. Hold this position for __________ seconds. 4. Slowly lower your leg to the starting position. 5. Let your leg relax completely after each repetition. Repeat __________ times. Complete this exercise __________ times a day. This information is not intended to replace advice given to you by your health care provider. Make sure you discuss any questions you have with your health care provider. Document Revised: 04/13/2018 Document Reviewed: 04/13/2018 [employer] Patient Education  2020 [employer] Hand Exercises Hand exercises can be helpful for almost anyone. These exercises can strengthen the hands, improve flexibility and movement, and increase blood flow to the hands. These results can make work and daily tasks easier. Hand exercises can be especially helpful for people who have joint pain from arthritis or have nerve damage from overuse (carpal tunnel syndrome). These exercises can also help people who have injured a hand. Exercises Most of these hand exercises are gentle stretching and motion exercises. It is usually safe to do them often throughout the day. Warming up your hands before exercise may help to reduce stiffness. You can do this with gentle massage or by placing your hands in warm water  for 10-15 minutes. It is normal to feel some stretching, pulling, tightness, or mild discomfort as you begin new exercises. This will gradually improve. Stop an exercise right away if you feel sudden, severe pain or your pain gets worse. Ask your health care provider which exercises are best for you. Knuckle bend or "claw" fist 1. Stand or sit with your arm, hand, and all five fingers pointed straight up. Make sure to keep your wrist straight during the exercise. 2. Gently bend your fingers down toward your palm until the tips of your fingers are touching the top of your palm. Keep your big knuckle straight and just bend the small knuckles in your fingers. 3. Hold this position for __________ seconds. 4. Straighten (extend) your fingers back to the starting position. Repeat this exercise 5-10 times with each hand. Full finger fist 1. Stand or sit with your arm, hand, and all five fingers pointed straight up. Make sure to keep your wrist straight during the exercise. 2. Gently bend your fingers into your palm until the tips of your fingers are touching the middle of your palm. 3. Hold this position for __________ seconds. 4. Extend your fingers back to the starting position, stretching every joint fully. Repeat this exercise 5-10 times with each hand. Straight fist 1. Stand or sit with your arm, hand, and all five fingers pointed straight up. Make sure to keep your wrist straight during the exercise. 2. Gently bend your fingers at the big knuckle, where your   fingers meet your hand, and the middle knuckle. Keep the knuckle at the tips of your fingers straight and try to touch the bottom of your palm. 3. Hold this position for __________ seconds. 4. Extend your fingers back to the starting position, stretching every joint fully. Repeat this exercise 5-10 times with each hand. Tabletop 1. Stand or sit with your arm, hand, and all five fingers pointed straight up. Make sure to keep your wrist  straight during the exercise. 2. Gently bend your fingers at the big knuckle, where your fingers meet your hand, as far down as you can while keeping the small knuckles in your fingers straight. Think of forming a tabletop with your fingers. 3. Hold this position for __________ seconds. 4. Extend your fingers back to the starting position, stretching every joint fully. Repeat this exercise 5-10 times with each hand. Finger spread 1. Place your hand flat on a table with your palm facing down. Make sure your wrist stays straight as you do this exercise. 2. Spread your fingers and thumb apart from each other as far as you can until you feel a gentle stretch. Hold this position for __________ seconds. 3. Bring your fingers and thumb tight together again. Hold this position for __________ seconds. Repeat this exercise 5-10 times with each hand. Making circles 1. Stand or sit with your arm, hand, and all five fingers pointed straight up. Make sure to keep your wrist straight during the exercise. 2. Make a circle by touching the tip of your thumb to the tip of your index finger. 3. Hold for __________ seconds. Then open your hand wide. 4. Repeat this motion with your thumb and each finger on your hand. Repeat this exercise 5-10 times with each hand. Thumb motion 1. Sit with your forearm resting on a table and your wrist straight. Your thumb should be facing up toward the ceiling. Keep your fingers relaxed as you move your thumb. 2. Lift your thumb up as high as you can toward the ceiling. Hold for __________ seconds. 3. Bend your thumb across your palm as far as you can, reaching the tip of your thumb for the small finger (pinkie) side of your palm. Hold for __________ seconds. Repeat this exercise 5-10 times with each hand. Grip strengthening  1. Hold a stress ball or other soft ball in the middle of your hand. 2. Slowly increase the pressure, squeezing the ball as much as you can without causing  pain. Think of bringing the tips of your fingers into the middle of your palm. All of your finger joints should bend when doing this exercise. 3. Hold your squeeze for __________ seconds, then relax. Repeat this exercise 5-10 times with each hand. Contact a health care provider if:  Your hand pain or discomfort gets much worse when you do an exercise.  Your hand pain or discomfort does not improve within 2 hours after you exercise. If you have any of these problems, stop doing these exercises right away. Do not do them again unless your health care provider says that you can. Get help right away if:  You develop sudden, severe hand pain or swelling. If this happens, stop doing these exercises right away. Do not do them again unless your health care provider says that you can. This information is not intended to replace advice given to you by your health care provider. Make sure you discuss any questions you have with your health care provider. Document Revised: 10/14/2018 Document Reviewed: 06/24/2018   [employer] Patient Education  2020 [employer]  

## 2020-06-07 ENCOUNTER — Other Ambulatory Visit: Payer: Self-pay | Admitting: Family Medicine

## 2020-06-07 DIAGNOSIS — E039 Hypothyroidism, unspecified: Secondary | ICD-10-CM

## 2020-07-03 ENCOUNTER — Other Ambulatory Visit: Payer: Medicare Other

## 2020-07-09 ENCOUNTER — Other Ambulatory Visit: Payer: Medicare Other

## 2020-07-16 ENCOUNTER — Ambulatory Visit: Payer: Medicare Other | Admitting: Rheumatology

## 2020-07-18 ENCOUNTER — Ambulatory Visit (INDEPENDENT_AMBULATORY_CARE_PROVIDER_SITE_OTHER): Payer: Medicare Other

## 2020-07-18 ENCOUNTER — Other Ambulatory Visit: Payer: Self-pay

## 2020-07-18 DIAGNOSIS — Z0000 Encounter for general adult medical examination without abnormal findings: Secondary | ICD-10-CM

## 2020-07-18 NOTE — Patient Instructions (Addendum)
Joanne Vaughn , Thank you for taking time to come for your Medicare Wellness Visit. I appreciate your ongoing commitment to your health goals. Please review the following plan we discussed and let me know if I can assist you in the future.   Screening recommendations/referrals: Colonoscopy: Done cologuard 03/17/19 Mammogram: Done 07/11/19 Bone Density: Done 07/11/19 Recommended yearly ophthalmology/optometry visit for glaucoma screening and checkup Recommended yearly dental visit for hygiene and checkup  Vaccinations: Influenza vaccine: Declined and discussed Pneumococcal vaccine: Declined and discussed Tdap vaccine: Up to date Shingles vaccine: Shingrix discussed. Please contact your pharmacy for coverage information.    Covid-19:Declined and Discussed  Advanced directives: Please bring a copy of your health care power of attorney and living will to the office at your convenience.  Conditions/risks identified: exercise more  Next appointment: Follow up in one year for your annual wellness visit    Preventive Care 65 Years and Older, Female Preventive care refers to lifestyle choices and visits with your health care provider that can promote health and wellness. What does preventive care include?  A yearly physical exam. This is also called an annual well check.  Dental exams once or twice a year.  Routine eye exams. Ask your health care provider how often you should have your eyes checked.  Personal lifestyle choices, including:  Daily care of your teeth and gums.  Regular physical activity.  Eating a healthy diet.  Avoiding tobacco and drug use.  Limiting alcohol use.  Practicing safe sex.  Taking low-dose aspirin every day.  Taking vitamin and mineral supplements as recommended by your health care provider. What happens during an annual well check? The services and screenings done by your health care provider during your annual well check will depend on your age,  overall health, lifestyle risk factors, and family history of disease. Counseling  Your health care provider may ask you questions about your:  Alcohol use.  Tobacco use.  Drug use.  Emotional well-being.  Home and relationship well-being.  Sexual activity.  Eating habits.  History of falls.  Memory and ability to understand (cognition).  Work and work Statistician.  Reproductive health. Screening  You may have the following tests or measurements:  Height, weight, and BMI.  Blood pressure.  Lipid and cholesterol levels. These may be checked every 5 years, or more frequently if you are over 60 years old.  Skin check.  Lung cancer screening. You may have this screening every year starting at age 5 if you have a 30-pack-year history of smoking and currently smoke or have quit within the past 15 years.  Fecal occult blood test (FOBT) of the stool. You may have this test every year starting at age 72.  Flexible sigmoidoscopy or colonoscopy. You may have a sigmoidoscopy every 5 years or a colonoscopy every 10 years starting at age 77.  Hepatitis C blood test.  Hepatitis B blood test.  Sexually transmitted disease (STD) testing.  Diabetes screening. This is done by checking your blood sugar (glucose) after you have not eaten for a while (fasting). You may have this done every 1-3 years.  Bone density scan. This is done to screen for osteoporosis. You may have this done starting at age 34.  Mammogram. This may be done every 1-2 years. Talk to your health care provider about how often you should have regular mammograms. Talk with your health care provider about your test results, treatment options, and if necessary, the need for more tests. Vaccines  Your  health care provider may recommend certain vaccines, such as:  Influenza vaccine. This is recommended every year.  Tetanus, diphtheria, and acellular pertussis (Tdap, Td) vaccine. You may need a Td booster every 10  years.  Zoster vaccine. You may need this after age 59.  Pneumococcal 13-valent conjugate (PCV13) vaccine. One dose is recommended after age 53.  Pneumococcal polysaccharide (PPSV23) vaccine. One dose is recommended after age 72. Talk to your health care provider about which screenings and vaccines you need and how often you need them. This information is not intended to replace advice given to you by your health care provider. Make sure you discuss any questions you have with your health care provider. Document Released: 07/20/2015 Document Revised: 03/12/2016 Document Reviewed: 04/24/2015 Elsevier Interactive Patient Education  2017 [place] Prevention in the Home Falls can cause injuries. They can happen to people of all ages. There are many things you can do to make your home safe and to help prevent falls. What can I do on the outside of my home?  Regularly fix the edges of walkways and driveways and fix any cracks.  Remove anything that might make you trip as you walk through a door, such as a raised step or threshold.  Trim any bushes or trees on the path to your home.  Use bright outdoor lighting.  Clear any walking paths of anything that might make someone trip, such as rocks or tools.  Regularly check to see if handrails are loose or broken. Make sure that both sides of any steps have handrails.  Any raised decks and porches should have guardrails on the edges.  Have any leaves, snow, or ice cleared regularly.  Use sand or salt on walking paths during winter.  Clean up any spills in your garage right away. This includes oil or grease spills. What can I do in the bathroom?  Use night lights.  Install grab bars by the toilet and in the tub and shower. Do not use towel bars as grab bars.  Use non-skid mats or decals in the tub or shower.  If you need to sit down in the shower, use a plastic, non-slip stool.  Keep the floor dry. Clean up any water that  spills on the floor as soon as it happens.  Remove soap buildup in the tub or shower regularly.  Attach bath mats securely with double-sided non-slip rug tape.  Do not have throw rugs and other things on the floor that can make you trip. What can I do in the bedroom?  Use night lights.  Make sure that you have a light by your bed that is easy to reach.  Do not use any sheets or blankets that are too big for your bed. They should not hang down onto the floor.  Have a firm chair that has side arms. You can use this for support while you get dressed.  Do not have throw rugs and other things on the floor that can make you trip. What can I do in the kitchen?  Clean up any spills right away.  Avoid walking on wet floors.  Keep items that you use a lot in easy-to-reach places.  If you need to reach something above you, use a strong step stool that has a grab bar.  Keep electrical cords out of the way.  Do not use floor polish or wax that makes floors slippery. If you must use wax, use non-skid floor wax.  Do not  have throw rugs and other things on the floor that can make you trip. What can I do with my stairs?  Do not leave any items on the stairs.  Make sure that there are handrails on both sides of the stairs and use them. Fix handrails that are broken or loose. Make sure that handrails are as long as the stairways.  Check any carpeting to make sure that it is firmly attached to the stairs. Fix any carpet that is loose or worn.  Avoid having throw rugs at the top or bottom of the stairs. If you do have throw rugs, attach them to the floor with carpet tape.  Make sure that you have a light switch at the top of the stairs and the bottom of the stairs. If you do not have them, ask someone to add them for you. What else can I do to help prevent falls?  Wear shoes that:  Do not have high heels.  Have rubber bottoms.  Are comfortable and fit you well.  Are closed at the  toe. Do not wear sandals.  If you use a stepladder:  Make sure that it is fully opened. Do not climb a closed stepladder.  Make sure that both sides of the stepladder are locked into place.  Ask someone to hold it for you, if possible.  Clearly mark and make sure that you can see:  Any grab bars or handrails.  First and last steps.  Where the edge of each step is.  Use tools that help you move around (mobility aids) if they are needed. These include:  Canes.  Walkers.  Scooters.  Crutches.  Turn on the lights when you go into a dark area. Replace any light bulbs as soon as they burn out.  Set up your furniture so you have a clear path. Avoid moving your furniture around.  If any of your floors are uneven, fix them.  If there are any pets around you, be aware of where they are.  Review your medicines with your doctor. Some medicines can make you feel dizzy. This can increase your chance of falling. Ask your doctor what other things that you can do to help prevent falls. This information is not intended to replace advice given to you by your health care provider. Make sure you discuss any questions you have with your health care provider. Document Released: 04/19/2009 Document Revised: 11/29/2015 Document Reviewed: 07/28/2014 Elsevier Interactive Patient Education  2017 [employer].

## 2020-07-18 NOTE — Progress Notes (Signed)
Virtual Visit via Telephone Note  I connected with  Joanne Vaughn on 07/18/20 at  3:15 PM EST by telephone and verified that I am speaking with the correct person using two identifiers.  Medicare Annual Wellness visit completed telephonically due to Covid-19 pandemic.   Persons participating in this call: This Health Coach and this patient.   Location: Patient: Home Provider: Office   I discussed the limitations, risks, security and privacy concerns of performing an evaluation and management service by telephone and the availability of in person appointments. The patient expressed understanding and agreed to proceed.  Unable to perform video visit due to video visit attempted and failed and/or patient does not have video capability.   Some vital signs may be absent or patient reported.   Willette Brace, LPN    Subjective:   Joanne Vaughn is a 70 y.o. female who presents for an Initial Medicare Annual Wellness Visit.  Review of Systems     Cardiac Risk Factors include: advanced age (>80men, >39 women);dyslipidemia     Objective:    There were no vitals filed for this visit. There is no height or weight on file to calculate BMI.  Advanced Directives 07/18/2020 10/22/2016 10/22/2016 10/16/2016 09/17/2013 09/17/2013  Does Patient Have a Medical Advance Directive? Yes No No No Patient does not have advance directive Patient does not have advance directive;Patient would not like information  Type of Scientist, forensic Power of [place];Living will - - - - -  Copy of [place] in Chart? No - copy requested - - - - -  Would patient like information on creating a medical advance directive? - No - Patient declined No - Patient declined No - Patient declined - -  Pre-existing out of facility DNR order (yellow form or pink MOST form) - - - - No No    Current Medications (verified) Outpatient Encounter Medications as of 07/18/2020  Medication Sig  .  clonazePAM (KLONOPIN) 0.5 MG tablet Take 1 tablet (0.5 mg total) by mouth daily as needed for anxiety.  Marland Kitchen levothyroxine (SYNTHROID) 100 MCG tablet Take 1 tablet (100 mcg total) by mouth daily.  Marland Kitchen OVER THE COUNTER MEDICATION Vitamin D3 and K2  . pilocarpine (SALAGEN) 5 MG tablet Take 1 tablet (5 mg total) by mouth 3 (three) times daily as needed. (Patient not taking: Reported on 07/18/2020)  . [DISCONTINUED] alendronate (FOSAMAX) 70 MG tablet Take 1 tablet (70 mg total) by mouth once a week. Take with a full glass of water on an empty stomach. (Patient not taking: Reported on 07/18/2020)   No facility-administered encounter medications on file as of 07/18/2020.    Allergies (verified) Fosamax [alendronate], Meloxicam, and Statins   History: Past Medical History:  Diagnosis Date  . Arthritis   . Chicken pox   . Diverticulosis   . Heart murmur    Past Surgical History:  Procedure Laterality Date  . ABDOMINAL HYSTERECTOMY     fibroid tumors; later removed ovaries because they were cystic  . BREAST BIOPSY  1973   benign  . FRACTURE SURGERY     Fracture of  L Hip and L Wrist 2015  . INTRAMEDULLARY (IM) NAIL INTERTROCHANTERIC Left 09/17/2013   Procedure: INTRAMEDULLARY (IM) NAIL INTERTROCHANTRIC;  Surgeon: [name], MD;  Location: [facility];  Service: Orthopedics;  Laterality: Left;  . ovaries removed  1994  . PARTIAL KNEE ARTHROPLASTY Right 10/22/2016   Procedure: RIGHT UNICOMPARTMENTAL KNEE;  Surgeon: Gaynelle Arabian, MD;  Location: [facility];  Service: Orthopedics;  Laterality: Right;   Family History  Problem Relation Age of Onset  . Arthritis Other        parent/grandparent  . Heart disease Other        parent/grandparent  . Diabetes Other   . Heart disease Mother 77       MI  . Other Mother        had fall and then had brain bleed  . COPD Father 33  . Osteoarthritis Father   . Diabetes Sister   . Breast cancer Sister   . COPD Sister   . Heart disease Maternal Grandmother    . Arthritis Maternal Grandfather   . Rheum arthritis Paternal Grandmother   . Heart Problems Son    Social History   Socioeconomic History  . Marital status: Married    Spouse name: Not on file  . Number of children: Not on file  . Years of education: Not on file  . Highest education level: Not on file  Occupational History  . Occupation: retired  Tobacco Use  . Smoking status: Never Smoker  . Smokeless tobacco: Never Used  Vaping Use  . Vaping Use: Never used  Substance and Sexual Activity  . Alcohol use: No  . Drug use: No  . Sexual activity: Not on file  Other Topics Concern  . Not on file  Social History Narrative   Work or School: homemaker, [employer] in [place]      Home Situation: lives with husband      Spiritual Beliefs: Mina Marble, Darrick Meigs      Lifestyle: elliptical 20 minutes a few times per week, very active - gardening, planting, healthy diet               Social Determinants of Health   Financial Resource Strain: [place]   . Difficulty of Paying Living Expenses: Not hard at all  Food Insecurity: No Food Insecurity  . Worried About Charity fundraiser in the Last Year: Never true  . Ran Out of Food in the Last Year: Never true  Transportation Needs: No Transportation Needs  . Lack of Transportation (Medical): No  . Lack of Transportation (Non-Medical): No  Physical Activity: Insufficiently Active  . Days of Exercise per Week: 3 days  . Minutes of Exercise per Session: 20 min  Stress: No Stress Concern Present  . Feeling of Stress : Not at all  Social Connections: Moderately Integrated  . Frequency of Communication with Friends and Family: More than three times a week  . Frequency of Social Gatherings with Friends and Family: More than three times a week  . Attends Religious Services: More than 4 times per year  . Active Member of Clubs or Organizations: No  . Attends Archivist Meetings: Never  . Marital Status:  Married    Tobacco Counseling Counseling given: Not Answered   Clinical Intake:  Pre-visit preparation completed: Yes  Pain : No/denies pain     BMI - recorded: 25.09 Nutritional Status: BMI 25 -29 Overweight Nutritional Risks: None Diabetes: No  How often do you need to have someone help you when you read instructions, pamphlets, or other written materials from your doctor or pharmacy?: 1 - Never  Diabetic?No  Interpreter Needed?: No  Information entered by :: Charlott Rakes, LPN   Activities of Daily Living In your present state of health, do you have any difficulty performing the following activities: 07/18/2020  Hearing? N  Vision? N  Difficulty concentrating or making decisions? Y  Comment a little different since covid  Walking or climbing stairs? Y  Comment arthritis  Dressing or bathing? N  Doing errands, shopping? N  Preparing Food and eating ? N  Using the Toilet? N  In the past six months, have you accidently leaked urine? N  Do you have problems with loss of bowel control? N  Managing your Medications? N  Managing your Finances? N  Housekeeping or managing your Housekeeping? N  Some recent data might be hidden    Patient Care Team: Caren Macadam, MD as PCP - General (Family Medicine) Satira Sark, MD as PCP - Cardiology (Cardiology)  Indicate any recent Medical Services you may have received from other than [facility] providers in the past year (date may be approximate).     Assessment:   This is a routine wellness examination for Joanne Vaughn.  Hearing/Vision screen  Hearing Screening   125Hz  250Hz  500Hz  1000Hz  2000Hz  3000Hz  4000Hz  6000Hz  8000Hz   Right ear:           Left ear:           Comments: Pt denies any hearing issues  Vision Screening Comments: Pt follows up with [facility] on [address]  Dietary issues and exercise activities discussed: Current Exercise Habits: Home exercise routine, Type of exercise: strength  training/weights;walking, Time (Minutes): 20, Frequency (Times/Week): 3, Weekly Exercise (Minutes/Week): 60  Goals    . Patient Stated     More exercise       Depression Screen PHQ 2/9 Scores 07/18/2020 04/29/2020 03/07/2019  PHQ - 2 Score 0 0 0    Fall Risk Fall Risk  07/18/2020  Falls in the past year? 0  Number falls in past yr: 0  Injury with Fall? 0  Risk for fall due to : Impaired vision  Follow up Falls prevention discussed    FALL RISK PREVENTION PERTAINING TO THE HOME:  Any stairs in or around the home? Yes  If so, are there any without handrails? No  Home free of loose throw rugs in walkways, pet beds, electrical cords, etc? Yes  Adequate lighting in your home to reduce risk of falls? Yes   ASSISTIVE DEVICES UTILIZED TO PREVENT FALLS:  Life alert? No  Use of a cane, walker or w/c? No  Grab bars in the bathroom? No  Shower chair or bench in shower? Yes  Elevated toilet seat or a handicapped toilet? No   TIMED UP AND GO:  Was the test performed? No .      Cognitive Function:     6CIT Screen 07/18/2020  What Year? 0 points  What month? 0 points  Count back from 20 0 points  Months in reverse 0 points  Repeat phrase 0 points    Immunizations Immunization History  Administered Date(s) Administered  . Influenza,inj,Quad PF,6+ Mos 08/28/2016, 04/30/2017  . Tdap 02/28/2013  . Zoster 02/28/2013    TDAP status: Up to date  Flu Vaccine status: Declined, Education has been provided regarding the importance of this vaccine but patient still declined. Advised may receive this vaccine at local pharmacy or Health Dept. Aware to provide a copy of the vaccination record if obtained from local pharmacy or Health Dept. Verbalized acceptance and understanding.  Pneumococcal vaccine status: Declined,  Education has been provided regarding the importance of this vaccine but patient still declined. Advised may receive this vaccine at local pharmacy or Health Dept.  Aware to provide a copy of the vaccination record if obtained from local pharmacy or Health Dept. Verbalized acceptance and understanding.   Covid-19 vaccine status: Declined, Education has been provided regarding the importance of this vaccine but patient still declined. Advised may receive this vaccine at local pharmacy or Health Dept.or vaccine clinic. Aware to provide a copy of the vaccination record if obtained from local pharmacy or Health Dept. Verbalized acceptance and understanding.  Qualifies for Shingles Vaccine? Yes   Zostavax completed Yes   Shingrix Completed?: No.    Education has been provided regarding the importance of this vaccine. Patient has been advised to call insurance company to determine out of pocket expense if they have not yet received this vaccine. Advised may also receive vaccine at local pharmacy or Health Dept. Verbalized acceptance and understanding.  Screening Tests Health Maintenance  Topic Date Due  . INFLUENZA VACCINE  10/04/2020 (Originally 02/05/2020)  . PNA vac Low Risk Adult (1 of 2 - PCV13) 07/18/2021 (Originally 06/21/2017)  . MAMMOGRAM  07/10/2021  . Fecal DNA (Cologuard)  03/16/2022  . TETANUS/TDAP  03/01/2023  . DEXA SCAN  Completed  . Hepatitis C Screening  Addressed  . COVID-19 Vaccine  Discontinued    Health Maintenance  There are no preventive care reminders to display for this patient.  Colorectal cancer screening: Type of screening: Cologuard. Completed 03/17/19. Repeat every 3 years  Mammogram status: Completed 07/11/19. Repeat every year  Bone Density status: Completed 07/11/19. Results reflect: Bone density results: OSTEOPOROSIS. Repeat every 2 years.   Additional Screening:  Hepatitis C Screening: Completed 04/18/15  Vision Screening: Recommended annual ophthalmology exams for early detection of glaucoma and other disorders of the eye. Is the patient up to date with their annual eye exam?  Yes  Who is the provider or what is the  name of the office in which the patient attends annual eye exams? [facility] [address]  Dental Screening: Recommended annual dental exams for proper oral hygiene  Community Resource Referral / Chronic Care Management: CRR required this visit?  No   CCM required this visit?  No      Plan:     I have personally reviewed and noted the following in the patient's chart:   . Medical and social history . Use of alcohol, tobacco or illicit drugs  . Current medications and supplements . Functional ability and status . Nutritional status . Physical activity . Advanced directives . List of other physicians . Hospitalizations, surgeries, and ER visits in previous 12 months . Vitals . Screenings to include cognitive, depression, and falls . Referrals and appointments  In addition, I have reviewed and discussed with patient certain preventive protocols, quality metrics, and best practice recommendations. A written personalized care plan for preventive services as well as general preventive health recommendations were provided to patient.     Willette Brace, LPN   9/38/1017   Nurse Notes: None

## 2020-08-07 ENCOUNTER — Ambulatory Visit: Payer: Medicare Other | Admitting: Rheumatology

## 2020-08-14 ENCOUNTER — Other Ambulatory Visit (HOSPITAL_COMMUNITY): Payer: Self-pay | Admitting: Family Medicine

## 2020-08-14 DIAGNOSIS — Z1231 Encounter for screening mammogram for malignant neoplasm of breast: Secondary | ICD-10-CM

## 2020-08-17 ENCOUNTER — Inpatient Hospital Stay (HOSPITAL_COMMUNITY): Admission: RE | Admit: 2020-08-17 | Payer: Medicare Other | Source: Ambulatory Visit

## 2020-08-17 ENCOUNTER — Encounter (HOSPITAL_COMMUNITY): Payer: Self-pay

## 2020-08-17 DIAGNOSIS — Z1231 Encounter for screening mammogram for malignant neoplasm of breast: Secondary | ICD-10-CM

## 2020-08-23 NOTE — Progress Notes (Deleted)
Office Visit Note  Patient: Joanne Vaughn             Date of Birth: 01/23/1951           MRN: [id number]             PCP: Caren Macadam, MD Referring: Caren Macadam, MD Visit Date: 09/05/2020 Occupation: @GUAROCC @  Subjective:  No chief complaint on file.   History of Present Illness: Kaitlyn Franko is a 70 y.o. female ***   Activities of Daily Living:  Patient reports morning stiffness for *** {minute/hour:19697}.   Patient {ACTIONS;DENIES/REPORTS:21021675::"Denies"} nocturnal pain.  Difficulty dressing/grooming: {ACTIONS;DENIES/REPORTS:21021675::"Denies"} Difficulty climbing stairs: {ACTIONS;DENIES/REPORTS:21021675::"Denies"} Difficulty getting out of chair: {ACTIONS;DENIES/REPORTS:21021675::"Denies"} Difficulty using hands for taps, buttons, cutlery, and/or writing: {ACTIONS;DENIES/REPORTS:21021675::"Denies"}  No Rheumatology ROS completed.   PMFS History:  Patient Active Problem List   Diagnosis Date Noted  . Hypothyroid 04/27/2020  . Vitamin D deficiency 10/14/2019  . B12 deficiency 10/14/2019  . Osteoporosis 07/18/2019  . OA (osteoarthritis) of knee 10/22/2016  . Hip fracture ([place]) 09/17/2013  . Patellofemoral dysfunction 10/12/2012  . Allergic rhinitis 10/12/2012  . Primary localized osteoarthritis of right knee 10/12/2012    Past Medical History:  Diagnosis Date  . Arthritis   . Chicken pox   . Diverticulosis   . Heart murmur     Family History  Problem Relation Age of Onset  . Arthritis Other        parent/grandparent  . Heart disease Other        parent/grandparent  . Diabetes Other   . Heart disease Mother 61       MI  . Other Mother        had fall and then had brain bleed  . COPD Father 28  . Osteoarthritis Father   . Diabetes Sister   . Breast cancer Sister   . COPD Sister   . Heart disease Maternal Grandmother   . Arthritis Maternal Grandfather   . Rheum arthritis Paternal Grandmother   . Heart Problems Son     Past Surgical History:  Procedure Laterality Date  . ABDOMINAL HYSTERECTOMY     fibroid tumors; later removed ovaries because they were cystic  . BREAST BIOPSY  1973   benign  . FRACTURE SURGERY     Fracture of  L Hip and L Wrist 2015  . INTRAMEDULLARY (IM) NAIL INTERTROCHANTERIC Left 09/17/2013   Procedure: INTRAMEDULLARY (IM) NAIL INTERTROCHANTRIC;  Surgeon: [name], MD;  Location: [facility];  Service: Orthopedics;  Laterality: Left;  . ovaries removed  1994  . PARTIAL KNEE ARTHROPLASTY Right 10/22/2016   Procedure: RIGHT UNICOMPARTMENTAL KNEE;  Surgeon: Gaynelle Arabian, MD;  Location: [facility];  Service: Orthopedics;  Laterality: Right;   Social History   Social History Narrative   Work or School: homemaker, [employer] in [place]      Home Situation: lives with husband      Spiritual Beliefs: Mina Marble, Darrick Meigs      Lifestyle: elliptical 20 minutes a few times per week, very active - gardening, planting, healthy diet               Immunization History  Administered Date(s) Administered  . Influenza,inj,Quad PF,6+ Mos 08/28/2016, 04/30/2017  . Tdap 02/28/2013  . Zoster 02/28/2013     Objective: Vital Signs: There were no vitals taken for this visit.   Physical Exam   Musculoskeletal Exam: ***  CDAI Exam: CDAI Score: - Patient Global: -; Provider  Global: - Swollen: -; Tender: - Joint Exam 09/05/2020   No joint exam has been documented for this visit   There is currently no information documented on the homunculus. Go to the Rheumatology activity and complete the homunculus joint exam.  Investigation: No additional findings.  Imaging: No results found.  Recent Labs: Lab Results  Component Value Date   WBC 5.9 02/01/2020   HGB 13.1 02/01/2020   PLT 210 02/01/2020   NA 139 02/01/2020   K 4.1 02/01/2020   CL 102 02/01/2020   CO2 32 02/01/2020   GLUCOSE 86 02/01/2020   BUN 19 02/01/2020   CREATININE 0.84 02/01/2020   BILITOT 0.5  02/01/2020   ALKPHOS 76 01/25/2019   AST 22 02/01/2020   ALT 15 02/01/2020   PROT 7.0 02/01/2020   ALBUMIN 4.2 01/25/2019   CALCIUM 9.0 02/01/2020   GFRAA >60 10/16/2016    Speciality Comments: No specialty comments available.  Procedures:  No procedures performed Allergies: Fosamax [alendronate], Meloxicam, and Statins   Assessment / Plan:     Visit Diagnoses: No diagnosis found.  Orders: No orders of the defined types were placed in this encounter.  No orders of the defined types were placed in this encounter.   Face-to-face time spent with patient was *** minutes. Greater than 50% of time was spent in counseling and coordination of care.  Follow-Up Instructions: No follow-ups on file.   Earnestine Mealing, CMA  Note - This record has been created using Editor, commissioning.  Chart creation errors have been sought, but may not always  have been located. Such creation errors do not reflect on  the standard of medical care.

## 2020-09-05 ENCOUNTER — Ambulatory Visit: Payer: Medicare Other | Admitting: Rheumatology

## 2020-09-05 DIAGNOSIS — E559 Vitamin D deficiency, unspecified: Secondary | ICD-10-CM

## 2020-09-05 DIAGNOSIS — U071 COVID-19: Secondary | ICD-10-CM

## 2020-09-05 DIAGNOSIS — E538 Deficiency of other specified B group vitamins: Secondary | ICD-10-CM

## 2020-09-05 DIAGNOSIS — M5136 Other intervertebral disc degeneration, lumbar region: Secondary | ICD-10-CM

## 2020-09-05 DIAGNOSIS — Z96651 Presence of right artificial knee joint: Secondary | ICD-10-CM

## 2020-09-05 DIAGNOSIS — M1712 Unilateral primary osteoarthritis, left knee: Secondary | ICD-10-CM

## 2020-09-05 DIAGNOSIS — M810 Age-related osteoporosis without current pathological fracture: Secondary | ICD-10-CM

## 2020-09-05 DIAGNOSIS — Z8781 Personal history of (healed) traumatic fracture: Secondary | ICD-10-CM

## 2020-09-05 DIAGNOSIS — M5030 Other cervical disc degeneration, unspecified cervical region: Secondary | ICD-10-CM

## 2020-09-05 DIAGNOSIS — M19041 Primary osteoarthritis, right hand: Secondary | ICD-10-CM

## 2020-09-05 DIAGNOSIS — M3500 Sicca syndrome, unspecified: Secondary | ICD-10-CM

## 2020-09-05 DIAGNOSIS — Z8261 Family history of arthritis: Secondary | ICD-10-CM

## 2020-12-03 ENCOUNTER — Encounter: Payer: Self-pay | Admitting: Family Medicine

## 2020-12-05 ENCOUNTER — Other Ambulatory Visit: Payer: Self-pay

## 2020-12-05 ENCOUNTER — Other Ambulatory Visit (INDEPENDENT_AMBULATORY_CARE_PROVIDER_SITE_OTHER): Payer: Medicare Other

## 2020-12-05 DIAGNOSIS — E039 Hypothyroidism, unspecified: Secondary | ICD-10-CM

## 2020-12-06 LAB — TSH: TSH: 3.19 u[IU]/mL (ref 0.35–4.50)

## 2021-07-24 ENCOUNTER — Ambulatory Visit: Payer: Medicare Other

## 2022-02-11 ENCOUNTER — Other Ambulatory Visit: Payer: Self-pay | Admitting: *Deleted

## 2022-02-11 DIAGNOSIS — E039 Hypothyroidism, unspecified: Secondary | ICD-10-CM

## 2022-02-12 ENCOUNTER — Ambulatory Visit
Admission: RE | Admit: 2022-02-12 | Discharge: 2022-02-12 | Disposition: A | Discharge status: Home / Self Care | Payer: Medicare Other | Source: Ambulatory Visit | Attending: *Deleted | Admitting: *Deleted

## 2022-02-12 DIAGNOSIS — E039 Hypothyroidism, unspecified: Secondary | ICD-10-CM

## 2022-06-09 ENCOUNTER — Telehealth: Payer: Self-pay | Admitting: Family Medicine

## 2022-06-09 NOTE — Telephone Encounter (Signed)
Left message for patient to call back and schedule Medicare Annual Wellness Visit (AWV) either virtually or phone. Left  my Joanne Vaughn number [phone]   Last AWV 07/18/20   please schedule with Jarrett Soho kim   45 min for awv-i and in office appointments 30 min for awv-s  phone/virtual appointments

## 2022-06-11 ENCOUNTER — Ambulatory Visit
Admission: RE | Admit: 2022-06-11 | Discharge: 2022-06-11 | Disposition: A | Discharge status: Home / Self Care | Payer: Medicare Other | Source: Ambulatory Visit | Attending: *Deleted | Admitting: *Deleted

## 2022-06-11 ENCOUNTER — Other Ambulatory Visit: Payer: Self-pay | Admitting: *Deleted

## 2022-06-11 DIAGNOSIS — G4489 Other headache syndrome: Secondary | ICD-10-CM

## 2022-06-24 ENCOUNTER — Telehealth (INDEPENDENT_AMBULATORY_CARE_PROVIDER_SITE_OTHER): Payer: Medicare Other | Admitting: Family Medicine

## 2022-06-24 VITALS — Ht 63.0 in | Wt 141.0 lb

## 2022-06-24 DIAGNOSIS — Z1211 Encounter for screening for malignant neoplasm of colon: Secondary | ICD-10-CM | POA: Diagnosis not present

## 2022-06-24 DIAGNOSIS — Z0000 Encounter for general adult medical examination without abnormal findings: Secondary | ICD-10-CM

## 2022-06-24 NOTE — Progress Notes (Signed)
PATIENT CHECK-IN and HEALTH RISK ASSESSMENT QUESTIONNAIRE:  -completed by phone/video for upcoming Medicare Preventive Visit  Pre-Visit Check-in: 1)Vitals (height, wt, BP, etc) - record in vitals section for visit on day of visit 2)Review and Update Medications, Allergies PMH, Surgeries, Social history in Epic 3)Hospitalizations in the last year with date/reason? No  4)Review and Update Care Team (patient's specialists) in Epic 5) Complete PHQ9 in Epic  6) Complete Fall Screening in Epic 7)Review all Health Maintenance Due and order under PCP if not done.  8)Medicare Wellness Questionnaire: Answer theses question about your habits: Do you drink alcohol? No If yes, how many drinks do you have a day?n/a Have you ever smoked?No Quit date if applicable? N/a  How many packs a day do/did you smoke? no Do you use smokeless tobacco?no Do you use an illicit drugs?no Do you exercises? Yes IF so, what type and how many days/minutes per week? Walking every day 10-15 mins. She also does some dumbells - 3x per week for 15 minutes.  Are you sexually active? No Number of partners?n/a Typical breakfast: not a breakfast person but when do have one- a piece of toast with egg, sausage Typical lunch: full balance meal- vegetable, protein-chicken, beef, fish, salad Typical dinner:no dinner Typical snacks: yogurt, a piece of fruit.   Beverages: water  Answer theses question about you: Can you perform most household chores?Yes Do you find it hard to follow a conversation in a noisy room?No Do you often ask people to speak up or repeat themselves?No Do you feel that you have a problem with memory?No Do you balance your checkbook and or bank acounts?Yes Do you feel safe at home?Yes Last dentist visit?3 weeks ago. Do you need assistance with any of the following: Please note if so No  Driving?  Feeding yourself?  Getting from bed to chair?  Getting to the toilet?  Bathing or showering?  Dressing  yourself?  Managing money?  Climbing a flight of stairs  Preparing meals?  Do you have Advanced Directives in place (Living Will, Healthcare Power or Attorney)? Yes   Last eye Exam and location? In the spring of this year with Dr. Bobetta Lime, ophthalmologist in [place] . Reports she had a macular pucker procedure in Spring of this year as well.    Do you currently use prescribed or non-prescribed narcotic or opioid pain medications?No  Do you have a history or close family history of breast, ovarian, tubal or peritoneal cancer or a family member with BRCA (breast cancer susceptibility 1 and 2) gene mutations? Sister had breast cancer. Now she is cancer free  Nurse/Assistant Credentials/time stamp: Karpuih M./CMA/2:44pm   ----------------------------------------------------------------------------------------------------------------------------------------------------------------------------------------------------------------------   MEDICARE ANNUAL PREVENTIVE VISIT WITH PROVIDER: (Welcome to Medicare, initial annual wellness or annual wellness exam)  Virtual Visit via Video Note  I connected with Audriella  on 06/24/2022 by a video enabled telemedicine application and verified that I am speaking with the correct person using two identifiers.  Location patient: home Location provider:work or home office Persons participating in the virtual visit: patient, provider  Concerns and/or follow up today: getting over a respiratory bug. She had it last week, now is doing better.    See HM section in Epic for other details of completed HM.    ROS: negative for report of fevers, unintentional weight loss, vision changes, vision loss, hearing loss or change, chest pain, sob, hemoptysis, melena, hematochezia, hematuria, genital discharge or lesions, falls, bleeding or bruising, loc, thoughts of suicide or self harm, memory loss  Patient-completed extensive health risk assessment - reviewed  and discussed with the patient: See Health Risk Assessment completed with patient prior to the visit either above or in recent phone note. This was reviewed in detailed with the patient today and appropriate recommendations, orders and referrals were placed as needed per Summary below and patient instructions.   Review of Medical History: -PMH, PSH, Family History and current specialty and care providers reviewed and updated and listed below   Patient Care Team: Caren Macadam, MD (Inactive) as PCP - General (Family Medicine) Satira Sark, MD as PCP - Cardiology (Cardiology)   Past Medical History:  Diagnosis Date   Arthritis    Chicken pox    Diverticulosis    Heart murmur     Past Surgical History:  Procedure Laterality Date   ABDOMINAL HYSTERECTOMY     fibroid tumors; later removed ovaries because they were cystic   BREAST BIOPSY  1973   benign   FRACTURE SURGERY     Fracture of  L Hip and L Wrist 2015   INTRAMEDULLARY (IM) NAIL INTERTROCHANTERIC Left 09/17/2013   Procedure: INTRAMEDULLARY (IM) NAIL INTERTROCHANTRIC;  Surgeon: [name], MD;  [facility]: [facility];  Service: Orthopedics;  Laterality: Left;   ovaries removed  1994   PARTIAL KNEE ARTHROPLASTY Right 10/22/2016   Procedure: RIGHT UNICOMPARTMENTAL KNEE;  Surgeon: Gaynelle Arabian, MD;  Location: [facility];  Service: Orthopedics;  Laterality: Right;    Social History   Socioeconomic History   Marital status: Married    Spouse name: Not on file   Number of children: Not on file   Years of education: Not on file   Highest education level: Not on file  Occupational History   Occupation: retired  Tobacco Use   Smoking status: Never   Smokeless tobacco: Never  Vaping Use   Vaping Use: Never used  Substance and Sexual Activity   Alcohol use: No   Drug use: No   Sexual activity: Not on file  Other Topics Concern   Not on file  Social History Narrative   Work or School: homemaker, [employer] in [place]      Home Situation: lives with husband      Spiritual Beliefs: Mina Marble, Christian      Lifestyle: elliptical 20 minutes a few times per week, very active - gardening, planting, healthy diet               Social Determinants of Health   Financial Resource Strain: [place]  (07/18/2020)   Overall Financial Resource Strain (CARDIA)    Difficulty of Paying Living Expenses: Not hard at all  Food Insecurity: No Food Insecurity (07/18/2020)   Hunger Vital Sign    Worried About Running Out of Food in the Last Year: Never true    [place] in the Last Year: Never true  Transportation Needs: No Transportation Needs (07/18/2020)   PRAPARE - Hydrologist (Medical): No    Lack of Transportation (Non-Medical): No  Physical Activity: Insufficiently Active (07/18/2020)   Exercise Vital Sign    Days of Exercise per Week: 3 days    Minutes of Exercise per Session: 20 min  Stress: No Stress Concern Present (07/18/2020)   [place]    Feeling of Stress : Not at all  Social Connections: Moderately Integrated (07/18/2020)   Social Connection and Isolation Panel [NHANES]  Frequency of Communication with Friends and Family: More than three times a week    Frequency of Social Gatherings with Friends and Family: More than three times a week    Attends Religious Services: More than 4 times per year    Active Member of [employer] or Organizations: No    Attends Archivist Meetings: Never    Marital Status: Married  Human resources officer Violence: Not At Risk (07/18/2020)   Humiliation, Afraid, Rape, and Kick questionnaire    Fear of Current or Ex-Partner: No    Emotionally Abused: No    Physically Abused: No    Sexually Abused: No    Family History  Problem Relation Age of Onset   Arthritis Other        parent/grandparent   Heart disease Other        parent/grandparent    Diabetes Other    Heart disease Mother 24       MI   Other Mother        had fall and then had brain bleed   COPD Father 81   Osteoarthritis Father    Diabetes Sister    Breast cancer Sister    COPD Sister    Heart disease Maternal Grandmother    Arthritis Maternal Grandfather    Rheum arthritis Paternal Grandmother    Heart Problems Son     Current Outpatient Medications on File Prior to Visit  Medication Sig Dispense Refill   clonazePAM (KLONOPIN) 0.5 MG tablet Take 1 tablet (0.5 mg total) by mouth daily as needed for anxiety. 20 tablet 0   levothyroxine (SYNTHROID) 100 MCG tablet Take 1 tablet (100 mcg total) by mouth daily. 90 tablet 1   OVER THE COUNTER MEDICATION Vitamin D3 and K2     pilocarpine (SALAGEN) 5 MG tablet Take 1 tablet (5 mg total) by mouth 3 (three) times daily as needed. (Patient not taking: Reported on 07/18/2020) 90 tablet 2   No current facility-administered medications on file prior to visit.    Allergies  Allergen Reactions   Fosamax [Alendronate]     Upset stomach   Meloxicam     Upset stomach   Statins Nausea Only and Other (See Comments)    Weakness and achy  intolerance       Physical Exam There were no vitals filed for this visit. Estimated body mass index is 24.98 kg/m as calculated from the following:   Height as of this encounter: _0  (1.6 m).   Weight as of this encounter: 141 lb (64 kg).  EKG (optional): deferred due to virtual visit  GENERAL: alert, oriented, appears well and in no acute distress; visual acuity grossly intact, full vision exam deferred due to pandemic and/or virtual encounter  HEENT: atraumatic, conjunttiva clear, no obvious abnormalities on inspection of external nose and ears  NECK: normal movements of the head and neck  LUNGS: on inspection no signs of respiratory distress, breathing rate appears normal, no obvious gross SOB, gasping or wheezing  CV: no obvious cyanosis  MS: moves all visible  extremities without noticeable abnormality  PSYCH/NEURO: pleasant and cooperative, no obvious depression or anxiety, speech and thought processing grossly intact, Cognitive function grossly intact        06/24/2022    2:34 PM 07/18/2020    3:15 PM 04/29/2020    1:36 PM 03/07/2019    1:00 PM  Depression screen PHQ 2/9  Decreased Interest 0 0 0 0  Down, Depressed, Hopeless 0 0  0 0  PHQ - 2 Score 0 0 0 0       07/18/2020    3:19 PM 06/24/2022    2:35 PM  Fall Risk  Falls in the past year? 0 0  Was there an injury with Fall? 0 0  Fall Risk Category Calculator 0 0  Fall Risk Category Low Low  Patient Fall Risk Level  Low fall risk  Patient at Risk for Falls Due to Impaired vision No Fall Risks  Fall risk Follow up Falls prevention discussed Falls evaluation completed     SUMMARY AND PLAN:  Encounter for Medicare annual wellness exam  Colon cancer screening - Plan: Cologuard    Discussed applicable health maintenance/preventive health measures and advised and referred or ordered per patient preferences:  Health Maintenance  Topic Date Due   Zoster Vaccines- Shingrix (1 of 2) Never done, discussed, she reports she doesn't really like to do vaccines.    Pneumonia Vaccine 95+ Years old (1 - PCV) Never done, discussed, see above   MAMMOGRAM  07/10/2021 - offered to assist in scheduling, she has number to call and agrees to schedule   INFLUENZA VACCINE  02/04/2022, discussed, see above   Fecal DNA (Cologuard)  03/16/2022, ordered for patient per her preference, advised if abnormal to schedule visit to discuss Advised to call office if has not received in 2 weeks.     DTaP/Tdap/Td (2 - Td or Tdap) 03/01/2023   Medicare Annual Wellness (AWV)  06/25/2023   DEXA SCAN  Completed   Hepatitis C Screening  Addressed   HPV VACCINES  Aged Out   COLONOSCOPY (Pts 45-59yr Insurance coverage will need to be confirmed)  Discontinued   COVID-19 Vaccine  Discontinued  She is due for  inperson physical and labs. She currently does not have a PCP but plans to schedule with Dr. MLegrand [place] husband is seeing her since her PCP left the practice. She plans to call to schedule.   Education and counseling on the following was provided based on the above review of health and a plan/checklist for the patient, along with additional information discussed, was provided for the patient in the patient instructions :    -Advised and counseled on maintaining healthy weight and healthy lifestyle - including the importance of a health diet, regular physical activity -Advised and counseled on a whole foods based healthy diet and regular exercise: discussed a heart healthy whole foods based diet at length. Recommended continued  regular exercise and adding balance exercises a few days per week -Advised yearly dental visits at minimum and regular eye exams   Follow up: see patient instructions     Patient Instructions  I really enjoyed getting to talk with you today! I am available on Tuesdays and Thursdays for virtual visits if you have any questions or concerns, or if I can be of any further assistance.   CHECKLIST FROM ANNUAL WELLNESS VISIT:  -Follow up (please call to schedule if not scheduled after visit):  -Inperson visit with your Primary Doctor office: call to schedule new patient visit with Dr. MHoover Browns-yearly for annual wellness visit with primary care office  Here is a list of your preventive care/health maintenance measures and the plan for each if any are due:  Health Maintenance  Topic Date Due   Zoster Vaccines- Shingrix (1 of 2) Never done   Pneumonia Vaccine 70 Years old (1 - PCV) Never done   MAMMOGRAM  07/10/2021   INFLUENZA VACCINE  02/04/2022   Fecal DNA (Cologuard)  03/16/2022   DTaP/Tdap/Td (2 - Td or Tdap) 03/01/2023   Medicare Annual Wellness (AWV)  06/25/2023   DEXA SCAN  Completed   Hepatitis C Screening  Addressed   HPV VACCINES  Aged Out   COLONOSCOPY  (Pts 45-88yr Insurance coverage will need to be confirmed)  Discontinued   COVID-19 Vaccine  Discontinued    -See a dentist at least yearly  -Get your eyes checked and then per your eye specialist's recommendations  -Other issues addressed today: -ordered the cologuard test - call office if you have not received it in 2 weeks, complete immediately once you receive it. If abnormal schedule follow up visit to address. -please schedule your mammogram asap  -I have included below further information regarding a healthy whole foods based diet, physical activity guidelines for adults, stress management and opportunities for social connections. I hope you find this information useful.   -----------------------------------------------------------------------------------------------------------------------------------------------------------------------------------------------------------------------------------------------------------  NUTRITION: -eat real food: lots of colorful vegetables (half the plate) and fruits -5-7 servings of vegetables and fruits per day (fresh or steamed is best), exp. 2 servings of vegetables with lunch and dinner and 2 servings of fruit per day. Berries and greens such as kale and collards are great choices.  -consume on a regular basis: whole grains (make sure first ingredient on label contains the word "whole"), fresh fruits, fish, nuts, seeds, healthy oils (such as olive oil, avocado oil, grape seed oil) -may eat small amounts of dairy and lean meat on occasion, but avoid processed meats such as ham, bacon, lunch meat, etc. -drink water -try to avoid fast food and pre-packaged foods, processed meat -most experts advise limiting sodium to < 23075mper day, should limit further is any chronic conditions such as high blood pressure, heart disease, diabetes, etc. The American Heart Association advised that < 150073ms is ideal -try to avoid foods that contain any  ingredients with names you do not recognize  -try to avoid sugar/sweets (except for the natural sugar that occurs in fresh fruit) -try to avoid sweet drinks -try to avoid white rice, white bread, pasta (unless whole grain), white or yellow potatoes  EXERCISE GUIDELINES FOR ADULTS: -if you wish to increase your physical activity, do so gradually and with the approval of your doctor -STOP and seek medical care immediately if you have any chest pain, chest discomfort or trouble breathing when starting or increasing exercise  -move and stretch your body, legs, feet and arms when sitting for long periods -Physical activity guidelines for optimal health in adults: -least 150 minutes per week of aerobic exercise (can talk, but not sing) once approved by your doctor, 20-30 minutes of sustained activity or two 10 minute episodes of sustained activity every day.  -resistance training at least 2 days per week if approved by your doctor -balance exercises 3+ days per week:   Stand somewhere where you have something sturdy to hold onto if you lose balance.    1) lift up on toes, start with 5x per day and work up to 20x   2) stand and lift on leg straight out to the side so that foot is a few inches of the floor, start with 5x each side and work up to 20x each side   3) stand on one foot, start with 5 seconds each side and work up to 20 seconds on each side  If you need ideas or help with getting more active:  -Silver sneakers https://tools.silversneakers.com  -  Walk with a Doc: http://stephens-thompson.biz/  -try to include resistance (weight lifting/strength building) and balance exercises twice per week: or the following link for ideas: ChessContest.fr  UpdateClothing.com.cy  STRESS MANAGEMENT: -can try meditating, or just sitting quietly with deep breathing while intentionally relaxing all parts of your body  for 5 minutes daily -if you need further help with stress, anxiety or depression please follow up with your primary doctor or contact the wonderful folks at [place]: [place]: -options in [place] if you wish to engage in more social and exercise related activities:  -Silver sneakers https://tools.silversneakers.com  -Walk with a Doc: http://stephens-thompson.biz/  -Check out the [place] 50+ section on the [place] [employer] (hiking clubs, book clubs, cards and games, chess, exercise classes, aquatic classes and much more) - see the website for details: https://www.[place]-.gov/departments/parks-recreation/active-adults50  -YouTube has lots of exercise videos for different ages and abilities as well  -[place] (a variety of indoor and outdoor inperson activities for adults). [phone]. [address].  -Virtual Online Classes (a variety of topics): see seniorplanet.org or call [phone]  -consider volunteering at a school, hospice center, church, senior center or elsewhere           Lucretia Kern, DO

## 2022-06-24 NOTE — Patient Instructions (Addendum)
I really enjoyed getting to talk with you today! I am available on Tuesdays and Thursdays for virtual visits if you have any questions or concerns, or if I can be of any further assistance.   CHECKLIST FROM ANNUAL WELLNESS VISIT:  -Follow up (please call to schedule if not scheduled after visit):  -Inperson visit with your Primary Doctor office: call to schedule new patient visit with Dr. Hoover Browns -yearly for annual wellness visit with primary care office  Here is a list of your preventive care/health maintenance measures and the plan for each if any are due:  Health Maintenance  Topic Date Due   Zoster Vaccines- Shingrix (1 of 2) Never done   Pneumonia Vaccine 62+ Years old (1 - PCV) Never done   MAMMOGRAM  07/10/2021   INFLUENZA VACCINE  02/04/2022   Fecal DNA (Cologuard)  03/16/2022   DTaP/Tdap/Td (2 - Td or Tdap) 03/01/2023   Medicare Annual Wellness (AWV)  06/25/2023   DEXA SCAN  Completed   Hepatitis C Screening  Addressed   HPV VACCINES  Aged Out   COLONOSCOPY (Pts 45-31yr Insurance coverage will need to be confirmed)  Discontinued   COVID-19 Vaccine  Discontinued    -See a dentist at least yearly  -Get your eyes checked and then per your eye specialist's recommendations  -Other issues addressed today: -ordered the cologuard test - call office if you have not received it in 2 weeks, complete immediately once you receive it. If abnormal schedule follow up visit to address. -please schedule your mammogram asap  -I have included below further information regarding a healthy whole foods based diet, physical activity guidelines for adults, stress management and opportunities for social connections. I hope you find this information useful.      NUTRITION: -eat real food: lots of colorful vegetables (half the plate) and fruits -5-7 servings of vegetables and fruits per day (fresh or steamed is best), exp. 2 servings of vegetables with lunch and dinner and 2 servings of fruit per day. Berries and greens such as kale and collards are great choices.  -consume on a regular basis: whole grains (make sure first ingredient on label contains the word "whole"), fresh fruits, fish, nuts, seeds, healthy oils (such as olive oil, avocado oil, grape seed oil) -may eat small amounts of dairy and lean meat on occasion, but avoid processed meats such as ham, bacon, lunch meat, etc. -drink water -try to avoid fast food and pre-packaged foods, processed meat -most experts advise limiting sodium to < '2300mg'$  per day, should limit further is any chronic conditions such as high blood pressure, heart disease, diabetes, etc. The American Heart Association advised that < '1500mg'$  is is ideal -try to avoid foods that contain any ingredients with names you do not recognize  -try to avoid sugar/sweets (except for the natural sugar that occurs in fresh fruit) -try to avoid sweet drinks -try to avoid white rice, white bread, pasta (unless whole grain), white or yellow potatoes  EXERCISE GUIDELINES FOR ADULTS: -if you wish to increase your physical activity, do so gradually and with the approval of your doctor -STOP and seek medical care immediately if you have any chest pain, chest discomfort or trouble breathing when starting or increasing exercise  -move and stretch your body, legs, feet and arms when sitting for long periods -Physical activity guidelines for optimal health in adults: -least 150 minutes per week of  aerobic exercise (can talk, but not sing) once approved by your doctor, 20-30 minutes of  sustained activity or two 10 minute episodes of sustained activity every day.  -resistance training at least 2 days per week if approved by your doctor -balance exercises 3+ days per week:   Stand somewhere where you have something sturdy to hold onto if you lose balance.    1) lift up on toes, start with 5x per day and work up to 20x   2) stand and lift on leg straight out to the side so that foot is a few inches of the floor, start with 5x each side and work up to 20x each side   3) stand on one foot, start with 5 seconds each side and work up to 20 seconds on each side  If you need ideas or help with getting more active:  -Silver sneakers https://tools.silversneakers.com  -Walk with a Doc: http://stephens-thompson.biz/  -try to include resistance (weight lifting/strength building) and balance exercises twice per week: or the following link for ideas: ChessContest.fr  UpdateClothing.com.cy  STRESS MANAGEMENT: -can try meditating, or just sitting quietly with deep breathing while intentionally relaxing all parts of your body for 5 minutes daily -if you need further help with stress, anxiety or depression please follow up with your primary doctor or contact the wonderful folks at [facility]: [place]: -options in [place] if you wish to engage in more social and exercise related activities:  -Silver sneakers https://tools.silversneakers.com  -Walk with a Doc: http://stephens-thompson.biz/  -Check out the [place] 50+ section on the [place] [employer] (hiking clubs, book clubs, cards and games, chess, exercise classes, aquatic classes and much more) - see the website for  details: https://www.[place].gov/departments/parks-recreation/active-adults50  -YouTube has lots of exercise videos for different ages and abilities as well  -[place] (a variety of indoor and outdoor inperson activities for adults). [phone]. [address]  -Virtual Online Classes (a variety of topics): see seniorplanet.org or call [phone]  -consider volunteering at a school, hospice center, church, senior center or elsewhere

## 2022-07-09 LAB — COLOGUARD: COLOGUARD: NEGATIVE

## 2022-09-24 ENCOUNTER — Telehealth: Payer: Self-pay

## 2022-09-24 NOTE — Telephone Encounter (Signed)
Spoke with patient message given about Cologuard results.     Patient at this time does not have a PCP.    Will contact office at a later date to set up new patient visit with new provider.         2 mo ago 3 yr ago  COLOGUARD Negative Negative

## 2023-04-29 ENCOUNTER — Emergency Department (HOSPITAL_COMMUNITY)
Admission: EM | Admit: 2023-04-29 | Discharge: 2023-04-29 | Disposition: A | Discharge status: Home / Self Care | Payer: Medicare Other | Attending: Emergency Medicine | Admitting: Emergency Medicine

## 2023-04-29 ENCOUNTER — Other Ambulatory Visit: Payer: Self-pay

## 2023-04-29 ENCOUNTER — Emergency Department (HOSPITAL_COMMUNITY): Payer: Medicare Other

## 2023-04-29 ENCOUNTER — Encounter (HOSPITAL_COMMUNITY): Payer: Self-pay | Admitting: Radiology

## 2023-04-29 DIAGNOSIS — S0592XA Unspecified injury of left eye and orbit, initial encounter: Secondary | ICD-10-CM | POA: Diagnosis present

## 2023-04-29 DIAGNOSIS — S01112A Laceration without foreign body of left eyelid and periocular area, initial encounter: Secondary | ICD-10-CM | POA: Diagnosis not present

## 2023-04-29 DIAGNOSIS — S0232XA Fracture of orbital floor, left side, initial encounter for closed fracture: Secondary | ICD-10-CM

## 2023-04-29 MED ORDER — ACETAMINOPHEN 325 MG PO TABS
650.0000 mg | ORAL_TABLET | Freq: Once | ORAL | Status: AC
  Administered 2023-04-29: 650 mg via ORAL
  Filled 2023-04-29: qty 2

## 2023-04-29 MED ORDER — AMOXICILLIN-POT CLAVULANATE 875-125 MG PO TABS: 1.0000 | ORAL_TABLET | Freq: Two times a day (BID) | ORAL | 0 refills | Status: AC

## 2023-04-29 MED ORDER — AMOXICILLIN-POT CLAVULANATE 875-125 MG PO TABS
1.0000 | ORAL_TABLET | Freq: Once | ORAL | Status: AC
  Administered 2023-04-29: 1 via ORAL
  Filled 2023-04-29: qty 1
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
## 2023-04-29 NOTE — Discharge Instructions (Addendum)
Contact a health care provider if: You have vision changes, such as increased blurriness or worsening double vision. You feel pain when you move your eyes. You feel nauseous or light-headed when you move your eyes. You have redness or swelling that does not go away or gets worse. You have blood or fluid coming from your nose. You have a fever or a headache. Get help right away if: You have a sensation of seeing flashing lights. You have sudden blindness. You have sudden bulging of the injured eye. Your heart is beating much more slowly than normal. You feel dizzy or you faint. You have chest pain. You are short of breath. These symptoms may represent a serious problem that is an emergency. Do not wait to see if the symptoms will go away. Get medical help right away. Call your local emergency services (911 in the [place] Do not drive yourself to the hospital.

## 2023-04-29 NOTE — ED Notes (Signed)
Attempted to call report to [facility] ED charge.
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
## 2023-04-29 NOTE — ED Provider Notes (Signed)
Patient here for evaluation of left eye injury.  Seen by Dr. Charlton Haws of ophthalmology for potential concern of medial canthus laceration.  He has seen her and dilated her eyes, checked her retinas.  No evidence of significant laceration requiring repair.  She does have an orbital floor fracture which is minimal.  Will cover with Augmentin.  Patient is not having severe pain or signs of entrapment.  She will follow-up with Dr. Alden Hipp in the office 1 week from now.  She appears otherwise appropriate for discharge this time   Joanne Vaughn 04/29/23 2111    Vaughn, Joanne Brim, MD 04/29/23 [phone]
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
## 2024-02-29 ENCOUNTER — Telehealth: Payer: Self-pay | Admitting: General Practice

## 2024-02-29 NOTE — Telephone Encounter (Signed)
 Pt has not established with another provider since Koberlein left. Asking to TOC to [place]. Please advise

## 2024-02-29 NOTE — Telephone Encounter (Signed)
fine with me, thx.

## 2024-04-18 ENCOUNTER — Encounter: Admitting: Family Medicine
